# Patient Record
Sex: Male | Born: 1985 | Race: Black or African American | Hispanic: No | Marital: Single | Smoking: Former smoker
Health system: Southern US, Community
[De-identification: ages and names within clinical notes are randomized; demographics above are authoritative.]

## PROBLEM LIST (undated history)

## (undated) ENCOUNTER — Ambulatory Visit: Admission: EM | Payer: BC Managed Care – PPO

## (undated) HISTORY — PX: TONSILLECTOMY: SUR1361

## (undated) HISTORY — PX: LEG SURGERY: SHX1003

---
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
## 2006-08-31 ENCOUNTER — Emergency Department (HOSPITAL_COMMUNITY): Admission: EM | Admit: 2006-08-31 | Discharge: 2006-08-31 | Payer: Self-pay | Admitting: Emergency Medicine

## 2007-05-07 ENCOUNTER — Emergency Department (HOSPITAL_COMMUNITY): Admission: EM | Admit: 2007-05-07 | Discharge: 2007-05-07 | Payer: Self-pay | Admitting: Emergency Medicine

## 2007-05-12 ENCOUNTER — Emergency Department (HOSPITAL_COMMUNITY): Admission: EM | Admit: 2007-05-12 | Discharge: 2007-05-12 | Payer: Self-pay | Admitting: Emergency Medicine

## 2007-12-14 ENCOUNTER — Emergency Department (HOSPITAL_COMMUNITY): Admission: EM | Admit: 2007-12-14 | Discharge: 2007-12-14 | Payer: Self-pay | Admitting: Emergency Medicine

## 2008-08-04 ENCOUNTER — Emergency Department (HOSPITAL_COMMUNITY): Admission: EM | Admit: 2008-08-04 | Discharge: 2008-08-04 | Payer: Self-pay | Admitting: Family Medicine

## 2008-09-29 ENCOUNTER — Emergency Department (HOSPITAL_COMMUNITY): Admission: EM | Admit: 2008-09-29 | Discharge: 2008-09-29 | Payer: Self-pay | Admitting: Emergency Medicine

## 2009-04-07 ENCOUNTER — Emergency Department (HOSPITAL_COMMUNITY): Admission: EM | Admit: 2009-04-07 | Discharge: 2009-04-07 | Payer: Self-pay | Admitting: Emergency Medicine

## 2009-04-07 ENCOUNTER — Emergency Department (HOSPITAL_COMMUNITY): Admission: EM | Admit: 2009-04-07 | Discharge: 2009-04-07 | Payer: Self-pay | Admitting: Family Medicine

## 2009-06-24 ENCOUNTER — Emergency Department (HOSPITAL_COMMUNITY): Admission: EM | Admit: 2009-06-24 | Discharge: 2009-06-24 | Payer: Self-pay | Admitting: Emergency Medicine

## 2010-08-22 ENCOUNTER — Emergency Department (HOSPITAL_COMMUNITY)
Admission: EM | Admit: 2010-08-22 | Discharge: 2010-08-22 | Disposition: A | Discharge status: Home / Self Care | Payer: Self-pay | Attending: Emergency Medicine | Admitting: Emergency Medicine

## 2010-08-22 ENCOUNTER — Emergency Department (HOSPITAL_COMMUNITY): Payer: Self-pay

## 2010-08-22 DIAGNOSIS — R0789 Other chest pain: Secondary | ICD-10-CM | POA: Insufficient documentation

## 2010-08-22 DIAGNOSIS — X58XXXA Exposure to other specified factors, initial encounter: Secondary | ICD-10-CM | POA: Insufficient documentation

## 2010-08-22 DIAGNOSIS — S139XXA Sprain of joints and ligaments of unspecified parts of neck, initial encounter: Secondary | ICD-10-CM | POA: Insufficient documentation

## 2010-08-22 DIAGNOSIS — M542 Cervicalgia: Secondary | ICD-10-CM | POA: Insufficient documentation

## 2010-08-22 DIAGNOSIS — Y92009 Unspecified place in unspecified non-institutional (private) residence as the place of occurrence of the external cause: Secondary | ICD-10-CM | POA: Insufficient documentation

## 2010-08-30 ENCOUNTER — Emergency Department (HOSPITAL_COMMUNITY): Payer: Self-pay

## 2010-08-30 ENCOUNTER — Emergency Department (HOSPITAL_COMMUNITY)
Admission: EM | Admit: 2010-08-30 | Discharge: 2010-08-30 | Disposition: A | Discharge status: Home / Self Care | Payer: Self-pay | Attending: Emergency Medicine | Admitting: Emergency Medicine

## 2010-08-30 DIAGNOSIS — R079 Chest pain, unspecified: Secondary | ICD-10-CM | POA: Insufficient documentation

## 2010-08-30 DIAGNOSIS — R109 Unspecified abdominal pain: Secondary | ICD-10-CM | POA: Insufficient documentation

## 2010-08-30 DIAGNOSIS — M542 Cervicalgia: Secondary | ICD-10-CM | POA: Insufficient documentation

## 2010-11-29 ENCOUNTER — Emergency Department (HOSPITAL_COMMUNITY)
Admission: EM | Admit: 2010-11-29 | Discharge: 2010-11-30 | Disposition: A | Discharge status: Home / Self Care | Payer: Self-pay | Attending: Emergency Medicine | Admitting: Emergency Medicine

## 2010-11-29 DIAGNOSIS — M25519 Pain in unspecified shoulder: Secondary | ICD-10-CM | POA: Insufficient documentation

## 2010-11-30 ENCOUNTER — Emergency Department (HOSPITAL_COMMUNITY): Payer: Self-pay

## 2011-01-02 ENCOUNTER — Emergency Department (HOSPITAL_COMMUNITY)
Admission: EM | Admit: 2011-01-02 | Discharge: 2011-01-02 | Disposition: A | Discharge status: Home / Self Care | Payer: Self-pay | Attending: Emergency Medicine | Admitting: Emergency Medicine

## 2011-01-02 DIAGNOSIS — S335XXA Sprain of ligaments of lumbar spine, initial encounter: Secondary | ICD-10-CM | POA: Insufficient documentation

## 2011-01-02 DIAGNOSIS — M545 Low back pain, unspecified: Secondary | ICD-10-CM | POA: Insufficient documentation

## 2011-01-02 DIAGNOSIS — X58XXXA Exposure to other specified factors, initial encounter: Secondary | ICD-10-CM | POA: Insufficient documentation

## 2011-03-07 ENCOUNTER — Emergency Department (HOSPITAL_COMMUNITY)
Admission: EM | Admit: 2011-03-07 | Discharge: 2011-03-07 | Disposition: A | Discharge status: Home / Self Care | Payer: Self-pay | Attending: Emergency Medicine | Admitting: Emergency Medicine

## 2011-03-07 DIAGNOSIS — K029 Dental caries, unspecified: Secondary | ICD-10-CM | POA: Insufficient documentation

## 2011-03-07 DIAGNOSIS — K1370 Unspecified lesions of oral mucosa: Secondary | ICD-10-CM | POA: Insufficient documentation

## 2011-03-07 DIAGNOSIS — F17200 Nicotine dependence, unspecified, uncomplicated: Secondary | ICD-10-CM | POA: Insufficient documentation

## 2012-01-14 ENCOUNTER — Encounter (HOSPITAL_COMMUNITY): Payer: Self-pay | Admitting: Emergency Medicine

## 2012-01-14 ENCOUNTER — Emergency Department (HOSPITAL_COMMUNITY)
Admission: EM | Admit: 2012-01-14 | Discharge: 2012-01-14 | Disposition: A | Discharge status: Home / Self Care | Payer: BC Managed Care – PPO | Attending: Emergency Medicine | Admitting: Emergency Medicine

## 2012-01-14 ENCOUNTER — Emergency Department (HOSPITAL_COMMUNITY): Payer: BC Managed Care – PPO

## 2012-01-14 DIAGNOSIS — M25461 Effusion, right knee: Secondary | ICD-10-CM

## 2012-01-14 DIAGNOSIS — M25469 Effusion, unspecified knee: Secondary | ICD-10-CM | POA: Insufficient documentation

## 2012-01-14 DIAGNOSIS — M25569 Pain in unspecified knee: Secondary | ICD-10-CM | POA: Insufficient documentation

## 2012-01-14 DIAGNOSIS — M25561 Pain in right knee: Secondary | ICD-10-CM

## 2012-01-14 LAB — CBC WITH DIFFERENTIAL/PLATELET
Hemoglobin: 15.3 g/dL (ref 13.0–17.0)
Lymphs Abs: 2 10*3/uL (ref 0.7–4.0)
Monocytes Relative: 7 % (ref 3–12)
Neutro Abs: 3.1 10*3/uL (ref 1.7–7.7)
Neutrophils Relative %: 54 % (ref 43–77)
Platelets: 186 10*3/uL (ref 150–400)
RBC: 5.32 MIL/uL (ref 4.22–5.81)
WBC: 5.7 10*3/uL (ref 4.0–10.5)

## 2012-01-14 LAB — URINALYSIS, ROUTINE W REFLEX MICROSCOPIC
Bilirubin Urine: NEGATIVE
Hgb urine dipstick: NEGATIVE
Nitrite: NEGATIVE
Specific Gravity, Urine: 1.026 (ref 1.005–1.030)
Urobilinogen, UA: 1 mg/dL (ref 0.0–1.0)
pH: 7 (ref 5.0–8.0)

## 2012-01-14 LAB — BASIC METABOLIC PANEL
BUN: 11 mg/dL (ref 6–23)
Chloride: 103 mEq/L (ref 96–112)
GFR calc Af Amer: >90 mL/min (ref >90)
GFR calc non Af Amer: >90 mL/min (ref >90)
Glucose, Bld: 104 mg/dL — ABNORMAL HIGH (ref 70–99)
Potassium: 3.9 mEq/L (ref 3.5–5.1)
Sodium: 138 mEq/L (ref 135–145)

## 2012-01-14 LAB — URIC ACID: Uric Acid, Serum: 5.7 mg/dL (ref 4.0–7.8)

## 2012-01-14 MED ORDER — OXYCODONE-ACETAMINOPHEN 5-325 MG PO TABS
ORAL_TABLET | ORAL | Status: AC
  Administered 2012-01-14: 1
  Filled 2012-01-14: qty 1

## 2012-01-14 MED ORDER — OXYCODONE-ACETAMINOPHEN 5-325 MG PO TABS: 1.0000 | ORAL_TABLET | Freq: Four times a day (QID) | ORAL | Status: AC | PRN

## 2012-01-14 MED ORDER — OXYCODONE-ACETAMINOPHEN 5-325 MG PO TABS
2.0000 | ORAL_TABLET | Freq: Once | ORAL | Status: AC
  Administered 2012-01-14: 2 via ORAL
  Filled 2012-01-14: qty 2

## 2012-01-14 NOTE — ED Provider Notes (Signed)
Medical screening examination/treatment/procedure(s) were conducted as a shared visit with non-physician practitioner(s) and myself.  I personally evaluated the patient during the encounter  Right knee pain with small effusion. No overlying cellulitis, redness, or warmth. Good range of motion.  Do not suspect septic joint.  Glynn Octave, MD 01/14/12 2250
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
## 2012-03-14 ENCOUNTER — Encounter (HOSPITAL_COMMUNITY): Payer: Self-pay | Admitting: Emergency Medicine

## 2012-03-14 ENCOUNTER — Emergency Department (INDEPENDENT_AMBULATORY_CARE_PROVIDER_SITE_OTHER)
Admission: EM | Admit: 2012-03-14 | Discharge: 2012-03-14 | Disposition: A | Discharge status: Home / Self Care | Payer: BC Managed Care – PPO | Source: Home / Self Care | Attending: Emergency Medicine | Admitting: Emergency Medicine

## 2012-03-14 DIAGNOSIS — I889 Nonspecific lymphadenitis, unspecified: Secondary | ICD-10-CM

## 2012-03-14 DIAGNOSIS — L0231 Cutaneous abscess of buttock: Secondary | ICD-10-CM

## 2012-03-14 MED ORDER — DOXYCYCLINE HYCLATE 100 MG PO CAPS: 100.0000 mg | ORAL_CAPSULE | Freq: Two times a day (BID) | ORAL | Status: AC

## 2012-03-14 MED ORDER — LIDOCAINE HCL (PF) 1 % IJ SOLN
INTRAMUSCULAR | Status: AC
  Filled 2012-03-14: qty 5

## 2012-03-14 MED ORDER — CEFTRIAXONE SODIUM 1 G IJ SOLR
1.0000 g | Freq: Once | INTRAMUSCULAR | Status: AC
  Administered 2012-03-14: 1 g via INTRAMUSCULAR

## 2012-03-14 MED ORDER — HYDROCODONE-ACETAMINOPHEN 5-500 MG PO TABS: 1.0000 | ORAL_TABLET | Freq: Four times a day (QID) | ORAL | Status: DC | PRN

## 2012-03-14 MED ORDER — HYDROCODONE-ACETAMINOPHEN 5-325 MG PO TABS
ORAL_TABLET | ORAL | Status: AC
  Filled 2012-03-14: qty 1

## 2012-03-14 MED ORDER — CEFTRIAXONE SODIUM 1 G IJ SOLR
INTRAMUSCULAR | Status: AC
  Filled 2012-03-14: qty 10

## 2012-03-14 MED ORDER — DOXYCYCLINE HYCLATE 100 MG PO CAPS: 100.0000 mg | ORAL_CAPSULE | Freq: Two times a day (BID) | ORAL | Status: DC

## 2012-03-14 NOTE — ED Provider Notes (Signed)
History     CSN: [id number]  Arrival date & time 03/14/12  1027   First MD Initiated Contact with Patient 03/14/12 1228      Chief Complaint  Patient presents with  . Recurrent Skin Infections    boils in btwn buttocks x 2 wks. pt is having pain no drainage. gradually getting worse.     (Consider location/radiation/quality/duration/timing/severity/associated sxs/prior treatment) HPI Comments: Patient presents urgent care complaining of an ongoing problem on the right side of his buttocks that have been progressively worse and larger for the last 2 weeks. It has not drained and is on have been applying heat and warm compresses. No fevers no chills he been using some pads with no relief. No fevers or chills and no drainage. Patient is also feeling some discomfort and pain and palpable bumps on his right groin area.  The history is provided by the patient.    History reviewed. No pertinent past medical history.  History reviewed. No pertinent past surgical history.  History reviewed. No pertinent family history.  History  Substance Use Topics  . Smoking status: Current Every Day Smoker -- 1.0 packs/day    Types: Cigarettes  . Smokeless tobacco: Not on file  . Alcohol Use: Yes     Comment: occasional      Review of Systems  Constitutional: Positive for appetite change. Negative for fever, chills, fatigue and unexpected weight change.  Skin: Negative for rash.    Allergies  Review of patient's allergies indicates no known allergies.  Home Medications   Current Outpatient Rx  Name  Route  Sig  Dispense  Refill  . DOXYCYCLINE HYCLATE 100 MG PO CAPS   Oral   Take 1 capsule (100 mg total) by mouth 2 (two) times daily.   20 capsule   0   . HYDROCODONE-ACETAMINOPHEN 5-500 MG PO TABS   Oral   Take 1-2 tablets by mouth every 6 (six) hours as needed for pain.   15 tablet   0     BP 122/73  Pulse 84  Temp 97.8 F (36.6 C) (Oral)  Resp 16  SpO2 100%  Physical  Exam  Nursing note and vitals reviewed. Constitutional: Vital signs are normal. He appears well-developed and well-nourished.  Non-toxic appearance. He does not have a sickly appearance. He does not appear ill. No distress.  Neck: Neck supple.  Abdominal: Soft. There is no tenderness.  Musculoskeletal:       Lumbar back: He exhibits swelling.       Back:       Legs: Neurological: He is alert.  Skin: There is erythema.    ED Course  INCISION AND DRAINAGE Performed by: Arthi Mcdonald Authorized by: Jimmie Molly Consent: Verbal consent obtained. Risks and benefits: risks, benefits and alternatives were discussed Consent given by: patient Patient understanding: patient states understanding of the procedure being performed Patient identity confirmed: verbally with patient Type: abscess Body area: anogenital Anesthesia: local infiltration Local anesthetic: lidocaine 1% with epinephrine Patient sedated: yes Scalpel size: 15 Needle gauge: 18 Complexity: complex Drainage: purulent Drainage amount: copious Packing material: 1/4 in iodoform gauze and 1/4 in gauze Patient tolerance: Patient tolerated the procedure well with no immediate complications.   (including critical care time)   Labs Reviewed  CULTURE, ROUTINE-ABSCESS   No results found.   1. Abscess of buttock   2. Lymphadenitis       MDM  Right buttocks moderate large abscess with regional lymphadenitis. I + D, abscess. Afebrile.  Sample obtained for cultures patient given a gram of ceftriaxone and started on doxycycline prescription for Lortab for pain management patient instructed to return in 2 days for packing removal and recheck. Patient agreed with treatment plan and followup care as instructed.         Jimmie Molly, MD 03/14/12 [phone]

## 2012-03-14 NOTE — ED Notes (Signed)
Pt c/o 4 boil in btwn buttocks area x 2wks.   Pt states gradual on set and having pain, denies drainage.  Has tried otc meds and boil pads with no relief.

## 2012-03-15 NOTE — ED Notes (Signed)
Final result pending

## 2012-03-16 ENCOUNTER — Emergency Department (INDEPENDENT_AMBULATORY_CARE_PROVIDER_SITE_OTHER)
Admission: EM | Admit: 2012-03-16 | Discharge: 2012-03-16 | Disposition: A | Discharge status: Home / Self Care | Payer: BC Managed Care – PPO | Source: Home / Self Care

## 2012-03-16 ENCOUNTER — Encounter (HOSPITAL_COMMUNITY): Payer: Self-pay

## 2012-03-16 DIAGNOSIS — L0291 Cutaneous abscess, unspecified: Secondary | ICD-10-CM

## 2012-03-16 DIAGNOSIS — Z4800 Encounter for change or removal of nonsurgical wound dressing: Secondary | ICD-10-CM

## 2012-03-16 NOTE — ED Provider Notes (Signed)
History     CSN: [id number]  Arrival date & time 03/16/12  1606   None     Chief Complaint  Patient presents with  . Follow-up    follow up on abcess    (Consider location/radiation/quality/duration/timing/severity/associated sxs/prior treatment) HPI Comments: This 26 year old patient was here 2 days ago with an abscess to the left buttock. The PCP treated him with incision and drainage and inserted a quarter-inch iodoform gauze. The patient states it is feeling better but he is still having some pain. He is here to have a wound check and packing removal. He is not having any systemic symptoms such as malaise or fever.   History reviewed. No pertinent past medical history.  History reviewed. No pertinent past surgical history.  History reviewed. No pertinent family history.  History  Substance Use Topics  . Smoking status: Current Every Day Smoker -- 1.0 packs/day    Types: Cigarettes  . Smokeless tobacco: Not on file  . Alcohol Use: Yes     Comment: occasional      Review of Systems  All other systems reviewed and are negative.    Allergies  Review of patient's allergies indicates no known allergies.  Home Medications   Current Outpatient Rx  Name  Route  Sig  Dispense  Refill  . DOXYCYCLINE HYCLATE 100 MG PO CAPS   Oral   Take 1 capsule (100 mg total) by mouth 2 (two) times daily.   20 capsule   0   . HYDROCODONE-ACETAMINOPHEN 5-500 MG PO TABS   Oral   Take 1-2 tablets by mouth every 6 (six) hours as needed for pain.   15 tablet   0     BP 116/69  Pulse 79  Temp 98.4 F (36.9 C) (Oral)  Resp 18  SpO2 99%  Physical Exam  Constitutional: He appears well-developed and well-nourished. No distress.  Skin: Skin is warm and dry. No rash noted. No erythema.       The packing remains in place. Once removed there is an approximately 4 x 4 centimeter area of induration but there is no additional drainage. There is also no erythema or evidence of  lymphangitis.    ED Course  Procedures (including critical care time)  Labs Reviewed - No data to display No results found.   1. Cellulitis and abscess       MDM  The packing was removed. Manual expression did not produce any additional exudate. A gauze dressing was reapplied. He can now take a shower however he should use a paper towel or similar cough to press on the wound to make out any water that may have injured the wound. The wound should be expected close within a couple days. After that he may use a Band-Aid. For any worsening such as development of an abscess as he had 2 days previously, increased pain draining swelling or redness he is to return. Continue taking all her antibiotic. Culture result was preliminary with a few diplococci bacteria, but no growth. Call results if there is evidence that the bacteria is susceptible to the doxycycline.        Hayden Rasmussen, NP 03/16/12 1754

## 2012-03-16 NOTE — ED Notes (Signed)
To stated had abcess I&D two days ago with packing. Here today to have packing removed. Pt states he started on antibiotics yesterday

## 2012-03-17 LAB — CULTURE, ROUTINE-ABSCESS: Gram Stain: NONE SEEN

## 2012-03-17 NOTE — ED Provider Notes (Signed)
Medical screening examination/treatment/procedure(s) were performed by resident physician or non-physician practitioner and as supervising physician I was immediately available for consultation/collaboration.   Fenix Ruppe DOUGLAS MD.    Jonmichael Beadnell D Fergie Sherbert, MD 03/17/12 0910 

## 2012-09-23 ENCOUNTER — Encounter (HOSPITAL_COMMUNITY): Payer: Self-pay | Admitting: Emergency Medicine

## 2012-09-23 ENCOUNTER — Emergency Department (HOSPITAL_COMMUNITY): Payer: BC Managed Care – PPO

## 2012-09-23 ENCOUNTER — Emergency Department (HOSPITAL_COMMUNITY)
Admission: EM | Admit: 2012-09-23 | Discharge: 2012-09-23 | Disposition: A | Discharge status: Home / Self Care | Payer: BC Managed Care – PPO | Attending: Emergency Medicine | Admitting: Emergency Medicine

## 2012-09-23 DIAGNOSIS — R0781 Pleurodynia: Secondary | ICD-10-CM

## 2012-09-23 DIAGNOSIS — F17200 Nicotine dependence, unspecified, uncomplicated: Secondary | ICD-10-CM | POA: Insufficient documentation

## 2012-09-23 DIAGNOSIS — S298XXA Other specified injuries of thorax, initial encounter: Secondary | ICD-10-CM | POA: Insufficient documentation

## 2012-09-23 MED ORDER — HYDROCODONE-ACETAMINOPHEN 5-325 MG PO TABS: 1.0000 | ORAL_TABLET | Freq: Four times a day (QID) | ORAL | Status: DC | PRN
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
## 2012-09-24 NOTE — ED Provider Notes (Signed)
Medical screening examination/treatment/procedure(s) were performed by non-physician practitioner and as supervising physician I was immediately available for consultation/collaboration.  Raeford Razor, MD 09/24/12 [phone]

## 2012-10-01 ENCOUNTER — Telehealth (HOSPITAL_COMMUNITY): Payer: Self-pay | Admitting: Emergency Medicine

## 2012-10-01 NOTE — ED Notes (Signed)
Patient in ED requesting Discharge Papers

## 2012-11-13 ENCOUNTER — Encounter (HOSPITAL_COMMUNITY): Payer: Self-pay | Admitting: Emergency Medicine

## 2012-11-13 ENCOUNTER — Emergency Department (HOSPITAL_COMMUNITY)
Admission: EM | Admit: 2012-11-13 | Discharge: 2012-11-13 | Disposition: A | Discharge status: Home / Self Care | Payer: Self-pay | Attending: Emergency Medicine | Admitting: Emergency Medicine

## 2012-11-13 DIAGNOSIS — F17200 Nicotine dependence, unspecified, uncomplicated: Secondary | ICD-10-CM | POA: Insufficient documentation

## 2012-11-13 DIAGNOSIS — R197 Diarrhea, unspecified: Secondary | ICD-10-CM | POA: Insufficient documentation

## 2012-11-13 DIAGNOSIS — R1110 Vomiting, unspecified: Secondary | ICD-10-CM | POA: Insufficient documentation

## 2012-11-13 DIAGNOSIS — R109 Unspecified abdominal pain: Secondary | ICD-10-CM | POA: Insufficient documentation

## 2012-11-13 LAB — CBC WITH DIFFERENTIAL/PLATELET
Basophils Absolute: 0 10*3/uL (ref 0.0–0.1)
HCT: 42.7 % (ref 39.0–52.0)
Hemoglobin: 14.5 g/dL (ref 13.0–17.0)
Lymphocytes Relative: 47 % — ABNORMAL HIGH (ref 12–46)
MCH: 28.9 pg (ref 26.0–34.0)
MCHC: 34 g/dL (ref 30.0–36.0)
MCV: 85.2 fL (ref 78.0–100.0)
Neutro Abs: 2.1 10*3/uL (ref 1.7–7.7)
Neutrophils Relative %: 43 % (ref 43–77)
Platelets: 191 10*3/uL (ref 150–400)
RBC: 5.01 MIL/uL (ref 4.22–5.81)
RDW: 13.6 % (ref 11.5–15.5)

## 2012-11-13 LAB — COMPREHENSIVE METABOLIC PANEL
ALT: 12 U/L (ref 0–53)
AST: 20 U/L (ref 0–37)
CO2: 24 mEq/L (ref 19–32)
Calcium: 9.2 mg/dL (ref 8.4–10.5)
Chloride: 105 mEq/L (ref 96–112)
GFR calc non Af Amer: >90 mL/min (ref >90)
Sodium: 139 mEq/L (ref 135–145)
Total Bilirubin: 0.4 mg/dL (ref 0.3–1.2)

## 2012-11-13 LAB — URINALYSIS, ROUTINE W REFLEX MICROSCOPIC
Hgb urine dipstick: NEGATIVE
Ketones, ur: 15 mg/dL — AB
Nitrite: NEGATIVE
Specific Gravity, Urine: 1.031 — ABNORMAL HIGH (ref 1.005–1.030)
pH: 5.5 (ref 5.0–8.0)

## 2012-11-13 MED ORDER — ONDANSETRON 4 MG PO TBDP
8.0000 mg | ORAL_TABLET | Freq: Once | ORAL | Status: DC
  Filled 2012-11-13: qty 2

## 2012-11-13 MED ORDER — ONDANSETRON HCL 8 MG PO TABS: 8.0000 mg | ORAL_TABLET | Freq: Two times a day (BID) | ORAL | Status: DC | PRN

## 2012-11-13 NOTE — ED Notes (Signed)
The pt has asked about the wait time x4  Every  Few minutes.  Wait time advised
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
## 2012-11-14 NOTE — ED Provider Notes (Signed)
   History    CSN: [id number] Arrival date & time 11/13/12  1910  First MD Initiated Contact with Patient 11/13/12 2157     Chief Complaint  Patient presents with  . Emesis  . Diarrhea    Patient is a 27 y.o. male presenting with vomiting. The history is provided by the patient.  Emesis Severity:  Mild Duration:  1 day Timing:  Intermittent Chronicity:  New Relieved by:  Nothing Worsened by:  Nothing tried Associated symptoms: diarrhea    Pt reports that he was exposed to someone with vomiting/diarrhea He reports that since yesterday he has had vomiting and diarrhea.  He also reports mild abd pain He has had about two episodes of both.  No blood in vomit or stool No fever/cough No rash He is otherwise healthy  PMH - none History reviewed. No pertinent past surgical history. No family history on file. History  Substance Use Topics  . Smoking status: Current Every Day Smoker -- 1.00 packs/day    Types: Cigarettes  . Smokeless tobacco: Not on file  . Alcohol Use: Yes     Comment: occasional    Review of Systems  Constitutional: Negative for fever.  Respiratory: Negative for cough.   Gastrointestinal: Positive for vomiting and diarrhea. Negative for blood in stool.  Musculoskeletal: Negative for back pain.  Neurological: Negative for weakness.  All other systems reviewed and are negative.    Allergies  Review of patient's allergies indicates no known allergies.  Home Medications   Current Outpatient Rx  Name  Route  Sig  Dispense  Refill  . ondansetron (ZOFRAN) 8 MG tablet   Oral   Take 1 tablet (8 mg total) by mouth every 12 (twelve) hours as needed for nausea.   4 tablet   0    BP 139/84  Pulse 69  Temp(Src) 98.4 F (36.9 C) (Oral)  Resp 16  SpO2 98% Physical Exam CONSTITUTIONAL: Well developed/well nourished HEAD: Normocephalic/atraumatic EYES: EOMI/PERRL, no icterus ENMT: Mucous membranes moist NECK: supple no meningeal signs SPINE:entire  spine nontender CV: S1/S2 noted, no murmurs/rubs/gallops noted LUNGS: Lungs are clear to auscultation bilaterally, no apparent distress ABDOMEN: soft, nontender, no rebound or guarding NEURO: Pt is awake/alert, moves all extremitiesx4 EXTREMITIES: pulses normal, full ROM SKIN: warm, color normal PSYCH: no abnormalities of mood noted  ED Course  Procedures Labs Reviewed  URINALYSIS, ROUTINE W REFLEX MICROSCOPIC - Abnormal; Notable for the following:    Specific Gravity, Urine 1.031 (*)    Bilirubin Urine SMALL (*)    Ketones, ur 15 (*)    All other components within normal limits  CBC WITH DIFFERENTIAL - Abnormal; Notable for the following:    Lymphocytes Relative 47 (*)    All other components within normal limits  COMPREHENSIVE METABOLIC PANEL - Abnormal; Notable for the following:    Glucose, Bld 127 (*)    All other components within normal limits   No results found. 1. Vomiting and diarrhea     Pt well appearing, no distress, most of his symptoms resolved.  Suspect viral gastroenteritis Pt is stable for d/c home   MDM  Nursing notes including past medical history and social history reviewed and considered in documentation Labs/vital reviewed and considered   Joya Gaskins, MD 11/14/12 0021

## 2012-12-16 ENCOUNTER — Emergency Department (HOSPITAL_COMMUNITY): Payer: BC Managed Care – PPO

## 2012-12-16 ENCOUNTER — Emergency Department (HOSPITAL_COMMUNITY)
Admission: EM | Admit: 2012-12-16 | Discharge: 2012-12-16 | Disposition: A | Discharge status: Home / Self Care | Payer: BC Managed Care – PPO | Attending: Emergency Medicine | Admitting: Emergency Medicine

## 2012-12-16 ENCOUNTER — Encounter (HOSPITAL_COMMUNITY): Payer: Self-pay | Admitting: Emergency Medicine

## 2012-12-16 DIAGNOSIS — J069 Acute upper respiratory infection, unspecified: Secondary | ICD-10-CM | POA: Insufficient documentation

## 2012-12-16 DIAGNOSIS — R599 Enlarged lymph nodes, unspecified: Secondary | ICD-10-CM | POA: Insufficient documentation

## 2012-12-16 DIAGNOSIS — R51 Headache: Secondary | ICD-10-CM | POA: Insufficient documentation

## 2012-12-16 DIAGNOSIS — R6883 Chills (without fever): Secondary | ICD-10-CM | POA: Insufficient documentation

## 2012-12-16 DIAGNOSIS — IMO0001 Reserved for inherently not codable concepts without codable children: Secondary | ICD-10-CM | POA: Insufficient documentation

## 2012-12-16 DIAGNOSIS — R05 Cough: Secondary | ICD-10-CM | POA: Insufficient documentation

## 2012-12-16 DIAGNOSIS — F17200 Nicotine dependence, unspecified, uncomplicated: Secondary | ICD-10-CM | POA: Insufficient documentation

## 2012-12-16 DIAGNOSIS — R079 Chest pain, unspecified: Secondary | ICD-10-CM | POA: Insufficient documentation

## 2012-12-16 DIAGNOSIS — R059 Cough, unspecified: Secondary | ICD-10-CM | POA: Insufficient documentation

## 2012-12-16 DIAGNOSIS — J3489 Other specified disorders of nose and nasal sinuses: Secondary | ICD-10-CM | POA: Insufficient documentation

## 2012-12-16 DIAGNOSIS — J029 Acute pharyngitis, unspecified: Secondary | ICD-10-CM | POA: Insufficient documentation

## 2012-12-16 MED ORDER — BUTAMBEN-TETRACAINE-BENZOCAINE 2-2-14 % EX AERO: 1.0000 | INHALATION_SPRAY | CUTANEOUS | Status: DC | PRN

## 2012-12-16 MED ORDER — ALBUTEROL SULFATE HFA 108 (90 BASE) MCG/ACT IN AERS
2.0000 | INHALATION_SPRAY | Freq: Once | RESPIRATORY_TRACT | Status: AC
  Administered 2012-12-16: 2 via RESPIRATORY_TRACT
  Filled 2012-12-16: qty 6.7

## 2012-12-16 MED ORDER — HYDROCODONE-HOMATROPINE 5-1.5 MG/5ML PO SYRP: 2.5000 mL | ORAL_SOLUTION | Freq: Four times a day (QID) | ORAL | Status: DC | PRN

## 2012-12-16 NOTE — ED Notes (Signed)
Pt c/o cough, chills, fever x 2 days. Tx with [place]

## 2012-12-16 NOTE — ED Provider Notes (Signed)
CSN: [id number]     Arrival date & time 12/16/12  1133 History     First MD Initiated Contact with Patient 12/16/12 1141     Chief Complaint  Patient presents with  . Cough  . Sore Throat  . Chills    c/o URI sx x 2 days   (Consider location/radiation/quality/duration/timing/severity/associated sxs/prior Treatment) Patient is a 27 y.o. male presenting with pharyngitis and URI. The history is provided by the patient. No language interpreter was used.  Sore Throat This is a new problem. Episode onset: 2 days. The problem occurs constantly. The problem has been gradually worsening. Associated symptoms include chest pain (with cough), chills, congestion, coughing, headaches, myalgias, a sore throat and swollen glands. Pertinent negatives include no abdominal pain, anorexia, arthralgias, change in bowel habit, diaphoresis, fatigue, fever, joint swelling, neck pain, numbness, rash, urinary symptoms, vertigo, visual change, vomiting or weakness. The symptoms are aggravated by coughing. Treatments tried: robitussin.  URI Presenting symptoms: congestion, cough and sore throat   Presenting symptoms: no fatigue and no fever   Associated symptoms: headaches, myalgias and swollen glands   Associated symptoms: no arthralgias and no neck pain   Risk factors: not elderly, no chronic cardiac disease, no chronic kidney disease, no chronic respiratory disease, no diabetes mellitus, no immunosuppression, no recent illness, no recent travel and no sick contacts   Risk factors comment:  Smoker   History reviewed. No pertinent past medical history. Past Surgical History  Procedure Laterality Date  . Tonsillectomy     Family History  Problem Relation Age of Onset  . Diabetes Other   . Hypertension Other    History  Substance Use Topics  . Smoking status: Current Every Day Smoker -- 1.00 packs/day    Types: Cigarettes  . Smokeless tobacco: Not on file  . Alcohol Use: Yes     Comment: occasional     Review of Systems  Constitutional: Positive for chills. Negative for fever, diaphoresis and fatigue.  HENT: Positive for congestion and sore throat. Negative for neck pain.   Respiratory: Positive for cough.   Cardiovascular: Positive for chest pain (with cough).  Gastrointestinal: Negative for vomiting, abdominal pain, anorexia and change in bowel habit.  Musculoskeletal: Positive for myalgias. Negative for joint swelling and arthralgias.  Skin: Negative for rash.  Neurological: Positive for headaches. Negative for vertigo, weakness and numbness.    Allergies  Review of patient's allergies indicates no known allergies.  Home Medications   Current Outpatient Rx  Name  Route  Sig  Dispense  Refill  . ondansetron (ZOFRAN) 8 MG tablet   Oral   Take 1 tablet (8 mg total) by mouth every 12 (twelve) hours as needed for nausea.   4 tablet   0    BP 112/66  Pulse 68  Temp(Src) 97.9 F (36.6 C) (Oral)  Wt 170 lb (77.111 kg)  SpO2 99% Physical Exam  Nursing note and vitals reviewed. Constitutional: He appears well-developed and well-nourished. No distress.  HENT:  Head: Normocephalic and atraumatic.  Eyes: Conjunctivae are normal. No scleral icterus.  Neck: Normal range of motion. Neck supple.  Cardiovascular: Normal rate, regular rhythm and normal heart sounds.   Pulmonary/Chest: Effort normal. No respiratory distress.  Expiratory wheezes heard best over left upper lobe  Abdominal: Soft. There is no tenderness.  Musculoskeletal: He exhibits no edema.  Neurological: He is alert.  Skin: Skin is warm and dry. He is not diaphoretic.  Psychiatric: His behavior is normal.  ED Course   Procedures (including critical care time)  Labs Reviewed  RAPID STREP SCREEN   No results found. 1. URI (upper respiratory infection)   2. Pharyngitis     MDM  BP 112/66  Pulse 68  Temp(Src) 97.9 F (36.6 C) (Oral)  Wt 170 lb (77.111 kg)  SpO2 99% Patient with c/o sxs URI.  Wheezing, cxr . Will treat with albuterol    12:31 PM BP 112/66  Pulse 68  Temp(Src) 97.9 F (36.6 C) (Oral)  Wt 170 lb (77.111 kg)  SpO2 99% Pt CXR negative for acute infiltrate. Patients symptoms are consistent with URI, likely viral etiology. Wheezing present.Discussed that antibiotics are not indicated for viral infections. Pt will be discharged with symptomatic treatment.  Verbalizes understanding and is agreeable with plan. Pt is hemodynamically stable & in NAD prior to dc.   Arthor Captain, PA-C 12/16/12 1312

## 2012-12-17 NOTE — ED Provider Notes (Signed)
Medical screening examination/treatment/procedure(s) were performed by non-physician practitioner and as supervising physician I was immediately available for consultation/collaboration.   Laray Anger, DO 12/17/12 [phone]

## 2012-12-18 LAB — CULTURE, GROUP A STREP

## 2013-04-24 ENCOUNTER — Encounter (HOSPITAL_COMMUNITY): Payer: Self-pay | Admitting: Emergency Medicine

## 2013-04-24 ENCOUNTER — Emergency Department (INDEPENDENT_AMBULATORY_CARE_PROVIDER_SITE_OTHER)
Admission: EM | Admit: 2013-04-24 | Discharge: 2013-04-24 | Disposition: A | Discharge status: Home / Self Care | Payer: BC Managed Care – PPO | Source: Home / Self Care | Attending: Family Medicine | Admitting: Family Medicine

## 2013-04-24 DIAGNOSIS — S0990XA Unspecified injury of head, initial encounter: Secondary | ICD-10-CM
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
## 2013-04-24 NOTE — ED Provider Notes (Signed)
Medical screening examination/treatment/procedure(s) were performed by resident physician or non-physician practitioner and as supervising physician I was immediately available for consultation/collaboration.   Velia Pamer DOUGLAS MD.   Kaeleigh Westendorf D Sharman Garrott, MD 04/24/13 2051 

## 2013-05-04 ENCOUNTER — Emergency Department (HOSPITAL_COMMUNITY)
Admission: EM | Admit: 2013-05-04 | Discharge: 2013-05-04 | Disposition: A | Discharge status: Home / Self Care | Payer: BC Managed Care – PPO | Attending: Emergency Medicine | Admitting: Emergency Medicine

## 2013-05-04 ENCOUNTER — Encounter (HOSPITAL_COMMUNITY): Payer: Self-pay | Admitting: Emergency Medicine

## 2013-05-04 ENCOUNTER — Emergency Department (HOSPITAL_COMMUNITY): Payer: BC Managed Care – PPO

## 2013-05-04 DIAGNOSIS — F0781 Postconcussional syndrome: Secondary | ICD-10-CM

## 2013-05-04 DIAGNOSIS — H5310 Unspecified subjective visual disturbances: Secondary | ICD-10-CM | POA: Insufficient documentation

## 2013-05-04 DIAGNOSIS — H9319 Tinnitus, unspecified ear: Secondary | ICD-10-CM | POA: Insufficient documentation

## 2013-05-04 DIAGNOSIS — Z87891 Personal history of nicotine dependence: Secondary | ICD-10-CM | POA: Insufficient documentation

## 2013-05-04 DIAGNOSIS — R110 Nausea: Secondary | ICD-10-CM | POA: Insufficient documentation

## 2013-05-04 MED ORDER — TRAMADOL HCL 50 MG PO TABS: 50.0000 mg | ORAL_TABLET | Freq: Four times a day (QID) | ORAL | Status: DC | PRN

## 2013-05-04 MED ORDER — NAPROXEN 500 MG PO TABS: 500.0000 mg | ORAL_TABLET | Freq: Two times a day (BID) | ORAL | Status: DC
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
## 2013-05-05 NOTE — ED Provider Notes (Signed)
Medical screening examination/treatment/procedure(s) were performed by non-physician practitioner and as supervising physician I was immediately available for consultation/collaboration.  Daisee Centner M Danayah Smyre, MD 05/05/13 1627 

## 2013-08-18 ENCOUNTER — Emergency Department (HOSPITAL_COMMUNITY)
Admission: EM | Admit: 2013-08-18 | Discharge: 2013-08-18 | Disposition: A | Discharge status: Home / Self Care | Payer: BC Managed Care – PPO | Attending: Emergency Medicine | Admitting: Emergency Medicine

## 2013-08-18 ENCOUNTER — Encounter (HOSPITAL_COMMUNITY): Payer: Self-pay | Admitting: Emergency Medicine

## 2013-08-18 DIAGNOSIS — Z87891 Personal history of nicotine dependence: Secondary | ICD-10-CM | POA: Insufficient documentation

## 2013-08-18 DIAGNOSIS — M549 Dorsalgia, unspecified: Secondary | ICD-10-CM

## 2013-08-18 DIAGNOSIS — M545 Low back pain, unspecified: Secondary | ICD-10-CM | POA: Insufficient documentation

## 2013-08-18 DIAGNOSIS — Z791 Long term (current) use of non-steroidal anti-inflammatories (NSAID): Secondary | ICD-10-CM | POA: Insufficient documentation

## 2013-08-18 MED ORDER — TRAMADOL HCL 50 MG PO TABS: 50.0000 mg | ORAL_TABLET | Freq: Four times a day (QID) | ORAL | Status: DC | PRN

## 2013-08-18 NOTE — ED Provider Notes (Signed)
Medical screening examination/treatment/procedure(s) were performed by non-physician practitioner and as supervising physician I was immediately available for consultation/collaboration.   EKG Interpretation None       Juliet RudeNathan R. Rubin PayorPickering, MD 08/18/13 2348

## 2013-08-18 NOTE — ED Provider Notes (Signed)
CSN: [id number]     Arrival date & time 08/18/13  1251 History  This chart was scribed for non-physician practitioner, Sharilyn SitesLisa Sanders, PA-C, working with Juliet RudeNathan R. Rubin PayorPickering, MD, by Ellin MayhewMichael Levi, ED Scribe. This patient was seen in room WTR5/WTR5 and the patient's care was started at 3:31 PM.  The history is provided by the patient. No language interpreter was used.   HPI Comments: Michael Garza is a 28 y.o. male who presents to the Emergency Department with a chief complaint of back pain with onset one week. Patient characterizes the pain as a severe ache which radiates down the R leg. Patient states that the pain is made worse with change in position and movement. Patient denies any fall or injury to the area. Patient denies any past back surgery or trauma. He denies any recent heavy lifting. He states he has taken Vicodin, naproxen, and tramadol that he had from last yet, with temporary relief. He is ambulatory to the ED. He works at [employer] for five years with no prior issues. He states he tried working yesterday and had to stop early. Patient reports not having a PCP.  Denies numbness or paresthesias of LE.  No loss of bowel or bladder control.  History reviewed. No pertinent past medical history. Past Surgical History  Procedure Laterality Date  . Tonsillectomy     Family History  Problem Relation Age of Onset  . Diabetes Other   . Hypertension Other    History  Substance Use Topics  . Smoking status: Former Smoker -- 1.00 packs/day    Types: Cigarettes  . Smokeless tobacco: Not on file  . Alcohol Use: Yes     Comment: occasional    Review of Systems  Constitutional: Negative for fever, chills and fatigue.  Respiratory: Negative for shortness of breath.   Gastrointestinal: Negative for nausea, vomiting and diarrhea.  Musculoskeletal: Positive for arthralgias and back pain. Negative for neck pain and neck stiffness.  Neurological: Negative for weakness.  All other systems reviewed and  are negative.  Allergies  Review of patient's allergies indicates no known allergies.  Home Medications   Current Outpatient Rx  Name  Route  Sig  Dispense  Refill  . aspirin-acetaminophen-caffeine (EXCEDRIN MIGRAINE) 250-250-65 MG per tablet   Oral   Take 1 tablet by mouth every 6 (six) hours as needed for headache.         . ibuprofen (ADVIL,MOTRIN) 200 MG tablet   Oral   Take 400 mg by mouth every 6 (six) hours as needed for headache.         . naproxen (NAPROSYN) 500 MG tablet   Oral   Take 1 tablet (500 mg total) by mouth 2 (two) times daily.   20 tablet   0   . traMADol (ULTRAM) 50 MG tablet   Oral   Take 1 tablet (50 mg total) by mouth every 6 (six) hours as needed.   15 tablet   0    Triage Vitals: BP 118/79  Pulse 62  Temp(Src) 98 F (36.7 C) (Oral)  Resp 16  SpO2 97%  Physical Exam  Nursing note and vitals reviewed. Constitutional: He is oriented to person, place, and time. He appears well-developed and well-nourished.  HENT:  Head: Normocephalic and atraumatic.  Mouth/Throat: Oropharynx is clear and moist.  Eyes: Conjunctivae and EOM are normal. Pupils are equal, round, and reactive to light.  Neck: Normal range of motion.  Cardiovascular: Normal rate, regular rhythm and normal heart sounds.  Pulmonary/Chest: Effort normal and breath sounds normal.  Abdominal: Soft. Bowel sounds are normal.  Musculoskeletal: Normal range of motion. He exhibits tenderness.       Lumbar back: He exhibits tenderness and pain.  Lumbar spine diffusely TTP without mid-line step off or noted deformity. + right straight leg rase. FROM maintained. Sensation intact diffusely throughout limb. Ambulating without difficulty.  Neurological: He is alert and oriented to person, place, and time.  Skin: Skin is warm and dry.  Psychiatric: He has a normal mood and affect.    ED Course  Procedures (including critical care time)  DIAGNOSTIC STUDIES: Oxygen Saturation is 97% on  room air, adequate by my interpretation.    COORDINATION OF CARE: 3:34 PM-Ordered tramadol for patient. Treatment plan discussed with patient and patient agrees.  Labs Review Labs Reviewed - No data to display Imaging Review No results found.   EKG Interpretation None      MDM   Final diagnoses:  Back pain   Atraumatic, diffuse low back pain. No midline step-off or deformities-- low suspicion for acute fx or subluxation.  No focal neuro deficits or symptoms concerning for cauda equina at this time. Patient will be discharged with tramadol. He will [facility].  At time of discharge pt became upset at choice of pain meds.  Pt stated to nursing staff "ya'll always give me tramadol and that shit don't work.  I'm here because I'm in pain, I'm not drug seeking or nothin."  Pt advised that he was seen and adequately evaluated by a provider and i did not feel that narcotics were indicated at this time.  Pt continued swearing at nursing staff and was escorted out of ED by security.  He ambulated out unassisted without difficulty.  I personally performed the services described in this documentation, which was scribed in my presence. The recorded information has been reviewed and is accurate.  Garlon HatchetLisa M Sanders, PA-C 08/18/13 1627

## 2013-08-18 NOTE — Discharge Instructions (Signed)
Take the prescribed medication as directed-- recommend taking with tylenol for maximum relief. May apply heat to low back to help with soreness. Follow-up with [facility] if no improvement in the next few days. Return to the ED for new or worsening symptoms.

## 2013-08-18 NOTE — ED Notes (Signed)
Pt reports lower middle back pain that started 1 week ago that is radiating down right leg. No bowel or bladder problems. Pt able to ambulate. Denies fall or injury.

## 2013-10-21 ENCOUNTER — Emergency Department (INDEPENDENT_AMBULATORY_CARE_PROVIDER_SITE_OTHER)
Admission: EM | Admit: 2013-10-21 | Discharge: 2013-10-21 | Disposition: A | Discharge status: Home / Self Care | Payer: BC Managed Care – PPO | Source: Home / Self Care | Attending: Emergency Medicine | Admitting: Emergency Medicine

## 2013-10-21 ENCOUNTER — Encounter (HOSPITAL_COMMUNITY): Payer: Self-pay | Admitting: Emergency Medicine

## 2013-10-21 DIAGNOSIS — X500XXA Overexertion from strenuous movement or load, initial encounter: Secondary | ICD-10-CM

## 2013-10-21 DIAGNOSIS — S4990XA Unspecified injury of shoulder and upper arm, unspecified arm, initial encounter: Secondary | ICD-10-CM

## 2013-10-21 DIAGNOSIS — S4980XA Other specified injuries of shoulder and upper arm, unspecified arm, initial encounter: Secondary | ICD-10-CM

## 2013-10-21 DIAGNOSIS — S4380XA Sprain of other specified parts of unspecified shoulder girdle, initial encounter: Secondary | ICD-10-CM

## 2013-10-21 DIAGNOSIS — Y939 Activity, unspecified: Secondary | ICD-10-CM

## 2013-10-21 DIAGNOSIS — M25512 Pain in left shoulder: Secondary | ICD-10-CM

## 2013-10-21 DIAGNOSIS — S46909A Unspecified injury of unspecified muscle, fascia and tendon at shoulder and upper arm level, unspecified arm, initial encounter: Secondary | ICD-10-CM

## 2013-10-21 DIAGNOSIS — M25519 Pain in unspecified shoulder: Secondary | ICD-10-CM

## 2013-10-21 DIAGNOSIS — S46819A Strain of other muscles, fascia and tendons at shoulder and upper arm level, unspecified arm, initial encounter: Secondary | ICD-10-CM

## 2013-10-21 MED ORDER — KETOROLAC TROMETHAMINE 60 MG/2ML IM SOLN
INTRAMUSCULAR | Status: AC
  Filled 2013-10-21: qty 2

## 2013-10-21 MED ORDER — MELOXICAM 15 MG PO TABS: ORAL_TABLET | ORAL | Status: DC

## 2013-10-21 MED ORDER — KETOROLAC TROMETHAMINE 60 MG/2ML IM SOLN
60.0000 mg | Freq: Once | INTRAMUSCULAR | Status: AC
  Administered 2013-10-21: 60 mg via INTRAMUSCULAR

## 2013-10-21 MED ORDER — TRAMADOL HCL 50 MG PO TABS: 50.0000 mg | ORAL_TABLET | Freq: Four times a day (QID) | ORAL | Status: DC | PRN

## 2013-10-21 NOTE — ED Provider Notes (Signed)
CSN: [id number]     Arrival date & time 10/21/13  1529 History   First MD Initiated Contact with Patient 10/21/13 1702     Chief Complaint  Patient presents with  . Shoulder Injury   (Consider location/radiation/quality/duration/timing/severity/associated sxs/prior Treatment) HPI Comments: Patient presents with pain and "spasm" to left scapular blade. He was lifting a bunk bed yesterday and felt a "pull" in the left upper side. The pain has continued despite Ibuprofen. His pain is dull left upper scapular region, radiates to upper shoulder and neck. No numbness or tingling, no weakness. Pain with ROM and felt "spasms" in the upper back.   Patient is a 28 y.o. male presenting with shoulder injury. The history is provided by the patient.  Shoulder Injury    History reviewed. No pertinent past medical history. Past Surgical History  Procedure Laterality Date  . Tonsillectomy     Family History  Problem Relation Age of Onset  . Diabetes Other   . Hypertension Other    History  Substance Use Topics  . Smoking status: Former Smoker -- 1.00 packs/day    Types: Cigarettes  . Smokeless tobacco: Not on file  . Alcohol Use: Yes     Comment: occasional    Review of Systems  All other systems reviewed and are negative.   Allergies  Review of patient's allergies indicates no known allergies.  Home Medications   Prior to Admission medications   Medication Sig Start Date End Date Taking? Authorizing Provider  meloxicam (MOBIC) 15 MG tablet 1 a day with food x 10 days then as needed for muscle and upper back pain 10/21/13   Riki SheerMichelle G Young, PA-C  naproxen (NAPROSYN) 500 MG tablet Take 1 tablet (500 mg total) by mouth 2 (two) times daily. 05/04/13   Renne CriglerJoshua Geiple, PA-C  traMADol (ULTRAM) 50 MG tablet Take 1 tablet (50 mg total) by mouth every 6 (six) hours as needed. 08/18/13   Garlon HatchetLisa M Sanders, PA-C  traMADol (ULTRAM) 50 MG tablet Take 1 tablet (50 mg total) by mouth every 6 (six) hours  as needed for moderate pain. 10/21/13   Riki SheerMichelle G Young, PA-C   BP 115/68  Pulse 63  Temp(Src) 98.5 F (36.9 C) (Oral)  Resp 16  SpO2 99% Physical Exam  Nursing note and vitals reviewed. Constitutional: He is oriented to person, place, and time. He appears well-developed and well-nourished. No distress.  HENT:  Head: Normocephalic and atraumatic.  Neck: Neck supple.  Full ROM in the neck with poor effort, no spasms in the neck  Pulmonary/Chest: Effort normal.  Musculoskeletal: He exhibits no edema.  Painful and decreased ROM in the left shoulder with poor effort, pain to palpation to multiple areas in the upper back and shoulder  Neurological: He is alert and oriented to person, place, and time. No cranial nerve deficit.  Skin: Skin is warm and dry. He is not diaphoretic.  Psychiatric: His behavior is normal.    ED Course  Procedures (including critical care time) Labs Review Labs Reviewed - No data to display  Imaging Review No results found.   MDM   1. Subscapularis (muscle) sprain and strain   2. Shoulder injury   3. Shoulder pain, left   Exam difficult secondary to patient compliance and poor effort. Otherwise normal ROM, multiple tender points. Requested work note, given. Orthopedic info given for f/u. NSAID's.  Daily with icing, and limited Tramadol as needed.     Riki SheerMichelle G Young, PA-C 10/21/13 1730

## 2013-10-21 NOTE — ED Notes (Signed)
Pt c/o left shoulder inj onset yest Reports he was setting up his kids bunk bed when he felt a pop Pain radiates to his left side of neck and back; increases w/activity Has been taking ibup w/no temp relief Alert w/no signs of acute distress.

## 2013-10-21 NOTE — Discharge Instructions (Signed)
Muscle Strain A muscle strain is an injury that occurs when a muscle is stretched beyond its normal length. Usually a small number of muscle fibers are torn when this happens. Muscle strain is rated in degrees. First-degree strains have the least amount of muscle fiber tearing and pain. Second-degree and third-degree strains have increasingly more tearing and pain.  Usually, recovery from muscle strain takes 1 2 weeks. Complete healing takes 5 6 weeks.  CAUSES  Muscle strain happens when a sudden, violent force placed on a muscle stretches it too far. This may occur with lifting, sports, or a fall.  RISK FACTORS Muscle strain is especially common in athletes.  SIGNS AND SYMPTOMS At the site of the muscle strain, there may be:  Pain.  Bruising.  Swelling.  Difficulty using the muscle due to pain or lack of normal function. DIAGNOSIS  Your health care provider will perform a physical exam and ask about your medical history. TREATMENT  Often, the best treatment for a muscle strain is resting, icing, and applying cold compresses to the injured area.  HOME CARE INSTRUCTIONS   Use the PRICE method of treatment to promote muscle healing during the first 2 3 days after your injury. The PRICE method involves:  Protecting the muscle from being injured again.  Restricting your activity and resting the injured body part.  Icing your injury. To do this, put ice in a plastic bag. Place a towel between your skin and the bag. Then, apply the ice and leave it on from 15 20 minutes each hour. After the third day, switch to moist heat packs.  Apply compression to the injured area with a splint or elastic bandage. Be careful not to wrap it too tightly. This may interfere with blood circulation or increase swelling.  Elevate the injured body part above the level of your heart as often as you can.  Only take over-the-counter or prescription medicines for pain, discomfort, or fever as directed by your  health care provider.  Warming up prior to exercise helps to prevent future muscle strains. SEEK MEDICAL CARE IF:   You have increasing pain or swelling in the injured area.  You have numbness, tingling, or a significant loss of strength in the injured area. MAKE SURE YOU:   Understand these instructions.  Will watch your condition.  Will get help right away if you are not doing well or get worse. Document Released: 04/24/2005 Document Revised: 02/12/2013 Document Reviewed: 11/21/2012 [facility]CExitCare Patient Information 2014 [place]Care, [place].   Ice to area 4x a day-20 min at a time, take Anti-inflammatory RX everyday for 10 days then as needed. Pain medication for severe pain only. F/U with Orthopedics if pain continues

## 2013-10-22 NOTE — ED Provider Notes (Signed)
Medical screening examination/treatment/procedure(s) were performed by non-physician practitioner and as supervising physician I was immediately available for consultation/collaboration.  David Keller, M.D.  David C Keller, MD 10/22/13 1411 

## 2014-05-05 ENCOUNTER — Emergency Department (HOSPITAL_COMMUNITY)
Admission: EM | Admit: 2014-05-05 | Discharge: 2014-05-05 | Disposition: A | Discharge status: Home / Self Care | Payer: BC Managed Care – PPO | Attending: Emergency Medicine | Admitting: Emergency Medicine

## 2014-05-05 ENCOUNTER — Encounter (HOSPITAL_COMMUNITY): Payer: Self-pay

## 2014-05-05 DIAGNOSIS — Z87891 Personal history of nicotine dependence: Secondary | ICD-10-CM | POA: Insufficient documentation

## 2014-05-05 DIAGNOSIS — M545 Low back pain, unspecified: Secondary | ICD-10-CM

## 2014-05-05 MED ORDER — TRAMADOL HCL 50 MG PO TABS
50.0000 mg | ORAL_TABLET | Freq: Four times a day (QID) | ORAL | Status: DC | PRN
Start: 2014-05-05 — End: 2014-12-06

## 2014-05-05 NOTE — Discharge Instructions (Signed)
It was our pleasure to provide your ER care today - we hope that you feel better.  Avoid bending at waist, or heavy lifting > 20 lbs for the next week.  Try heat/heating pad to sore area.   Take motrin as need for pain.  You may also take ultram as need for pain - no driving when taking ultram  Follow up with primary care doctor in week if symptoms fail to improve/resolve.  Return to ER if worse, new symptoms, fevers, numbness/weakness, other concern.    Back Pain, Adult Low back pain is very common. About 1 in 5 people have back pain.The cause of low back pain is rarely dangerous. The pain often gets better over time.About half of people with a sudden onset of back pain feel better in just 2 weeks. About 8 in 10 people feel better by 6 weeks.  CAUSES Some common causes of back pain include:  Strain of the muscles or ligaments supporting the spine.  Wear and tear (degeneration) of the spinal discs.  Arthritis.  Direct injury to the back. DIAGNOSIS Most of the time, the direct cause of low back pain is not known.However, back pain can be treated effectively even when the exact cause of the pain is unknown.Answering your caregiver's questions about your overall health and symptoms is one of the most accurate ways to make sure the cause of your pain is not dangerous. If your caregiver needs more information, he or she may order lab work or imaging tests (X-rays or MRIs).However, even if imaging tests show changes in your back, this usually does not require surgery. HOME CARE INSTRUCTIONS For many people, back pain returns.Since low back pain is rarely dangerous, it is often a condition that people can learn to [facility] Campusmanageon their own.   Remain active. It is stressful on the back to sit or stand in one place. Do not sit, drive, or stand in one place for more than 30 minutes at a time. Take short walks on level surfaces as soon as pain allows.Try to increase the length of time you walk  each day.  Do not stay in bed.Resting more than 1 or 2 days can delay your recovery.  Do not avoid exercise or work.Your body is made to move.It is not dangerous to be active, even though your back may hurt.Your back will likely heal faster if you return to being active before your pain is gone.  Pay attention to your body when you bend and lift. Many people have less discomfortwhen lifting if they bend their knees, keep the load close to their bodies,and avoid twisting. Often, the most comfortable positions are those that put less stress on your recovering back.  Find a comfortable position to sleep. Use a firm mattress and lie on your side with your knees slightly bent. If you lie on your back, put a pillow under your knees.  Only take over-the-counter or prescription medicines as directed by your caregiver. Over-the-counter medicines to reduce pain and inflammation are often the most helpful.Your caregiver may prescribe muscle relaxant drugs.These medicines help dull your pain so you can more quickly return to your normal activities and healthy exercise.  Put ice on the injured area.  Put ice in a plastic bag.  Place a towel between your skin and the bag.  Leave the ice on for 15-20 minutes, 03-04 times a day for the first 2 to 3 days. After that, ice and heat may be alternated to reduce pain and  spasms.  Ask your caregiver about trying back exercises and gentle massage. This may be of some benefit.  Avoid feeling anxious or stressed.Stress increases muscle tension and can worsen back pain.It is important to recognize when you are anxious or stressed and learn ways to manage it.Exercise is a great option. SEEK MEDICAL CARE IF:  You have pain that is not relieved with rest or medicine.  You have pain that does not improve in 1 week.  You have new symptoms.  You are generally not feeling well. SEEK IMMEDIATE MEDICAL CARE IF:   You have pain that radiates from your back  into your legs.  You develop new bowel or bladder control problems.  You have unusual weakness or numbness in your arms or legs.  You develop nausea or vomiting.  You develop abdominal pain.  You feel faint. Document Released: 04/24/2005 Document Revised: 10/24/2011 Document Reviewed: 08/26/2013 [facility] Patient Information 2015 [place], [place]. This information is not intended to replace advice given to you by your health care provider. Make sure you discuss any questions you have with your health care provider.    Lumbosacral Strain Lumbosacral strain is a strain of any of the parts that make up your lumbosacral vertebrae. Your lumbosacral vertebrae are the bones that make up the lower third of your backbone. Your lumbosacral vertebrae are held together by muscles and tough, fibrous tissue (ligaments).  CAUSES  A sudden blow to your back can cause lumbosacral strain. Also, anything that causes an excessive stretch of the muscles in the low back can cause this strain. This is typically seen when people exert themselves strenuously, fall, lift heavy objects, bend, or crouch repeatedly. RISK FACTORS  Physically demanding work.  Participation in pushing or pulling sports or sports that require a sudden twist of the back (tennis, golf, baseball).  Weight lifting.  Excessive lower back curvature.  Forward-tilted pelvis.  Weak back or abdominal muscles or both.  Tight hamstrings. SIGNS AND SYMPTOMS  Lumbosacral strain may cause pain in the area of your injury or pain that moves (radiates) down your leg.  DIAGNOSIS Your health care provider can often diagnose lumbosacral strain through a physical exam. In some cases, you may need tests such as X-ray exams.  TREATMENT  Treatment for your lower back injury depends on many factors that your clinician will have to evaluate. However, most treatment will include the use of anti-inflammatory medicines. HOME CARE INSTRUCTIONS   Avoid  hard physical activities (tennis, racquetball, waterskiing) if you are not in proper physical condition for it. This may aggravate or create problems.  If you have a back problem, avoid sports requiring sudden body movements. Swimming and walking are generally safer activities.  Maintain good posture.  Maintain a healthy weight.  For acute conditions, you may put ice on the injured area.  Put ice in a plastic bag.  Place a towel between your skin and the bag.  Leave the ice on for 20 minutes, 2-3 times a day.  When the low back starts healing, stretching and strengthening exercises may be recommended. SEEK MEDICAL CARE IF:  Your back pain is getting worse.  You experience severe back pain not relieved with medicines. SEEK IMMEDIATE MEDICAL CARE IF:   You have numbness, tingling, weakness, or problems with the use of your arms or legs.  There is a change in bowel or bladder control.  You have increasing pain in any area of the body, including your belly (abdomen).  You notice shortness of  breath, dizziness, or feel faint.  You feel sick to your stomach (nauseous), are throwing up (vomiting), or become sweaty.  You notice discoloration of your toes or legs, or your feet get very cold. MAKE SURE YOU:   Understand these instructions.  Will watch your condition.  Will get help right away if you are not doing well or get worse. Document Released: 02/01/2005 Document Revised: 04/29/2013 Document Reviewed: 12/11/2012 [facility]ExitCare Patient Information 2015 [place], [place]LL[place]. This information is not intended to replace advice given to you by your health care provider. Make sure you discuss any questions you have with your health care provider.    Heat Therapy Heat therapy can help ease sore, stiff, injured, and tight muscles and joints. Heat relaxes your muscles, which may help ease your pain.  RISKS AND COMPLICATIONS If you have any of the following conditions, do not use heat  therapy unless your health care provider has approved:  Poor circulation.  Healing wounds or scarred skin in the area being treated.  Diabetes, heart disease, or high blood pressure.  Not being able to feel (numbness) the area being treated.  Unusual swelling of the area being treated.  Active infections.  Blood clots.  Cancer.  Inability to communicate pain. This may include young children and people who have problems with their brain function (dementia).  Pregnancy. Heat therapy should only be used on old, pre-existing, or long-lasting (chronic) injuries. Do not use heat therapy on new injuries unless directed by your health care provider. HOW TO USE HEAT THERAPY There are several different kinds of heat therapy, including:  Moist heat pack.  Warm water bath.  Hot water bottle.  Electric heating pad.  Heated gel pack.  Heated wrap.  Electric heating pad. Use the heat therapy method suggested by your health care provider. Follow your health care provider's instructions on when and how to use heat therapy. GENERAL HEAT THERAPY RECOMMENDATIONS  Do not sleep while using heat therapy. Only use heat therapy while you are awake.  Your skin may turn pink while using heat therapy. Do not use heat therapy if your skin turns red.  Do not use heat therapy if you have new pain.  High heat or long exposure to heat can cause burns. Be careful when using heat therapy to avoid burning your skin.  Do not use heat therapy on areas of your skin that are already irritated, such as with a rash or sunburn. SEEK MEDICAL CARE IF:  You have blisters, redness, swelling, or numbness.  You have new pain.  Your pain is worse. MAKE SURE YOU:  Understand these instructions.  Will watch your condition.  Will get help right away if you are not doing well or get worse. Document Released: 07/17/2011 Document Revised: 09/08/2013 Document Reviewed: 06/17/2013 [facility]burgExitCare Patient  Information 2015 [place], [place]. This information is not intended to replace advice given to you by your health care provider. Make sure you discuss any questions you have with your health care provider.

## 2014-05-05 NOTE — ED Provider Notes (Signed)
CSN: [id number]     Arrival date & time 05/05/14  [id number] History   First MD Initiated Contact with Patient 05/05/14 0757     Chief Complaint  Patient presents with  . Back Pain     (Consider location/radiation/quality/duration/timing/severity/associated sxs/prior Treatment) Patient is a 28 y.o. male presenting with back pain. The history is provided by the patient.  Back Pain Associated symptoms: no abdominal pain, no chest pain, no dysuria, no fever, no numbness and no weakness   pt c/o low back pain for the past several days. Pain dull, moderate, constant, non radiating, worse w bending at waist or certain positional changes. Hx intermittent back pain in past, no prior back surgery. No radicular pain. No numbness/weakness. Pt states has been doing some bending and lifting. No direct trauma or fall. No fever or chills. No gi or gu c/o.      History reviewed. No pertinent past medical history. Past Surgical History  Procedure Laterality Date  . Tonsillectomy    . Leg surgery     Family History  Problem Relation Age of Onset  . Diabetes Other   . Hypertension Other    History  Substance Use Topics  . Smoking status: Former Smoker -- 1.00 packs/day    Types: Cigarettes  . Smokeless tobacco: Not on file  . Alcohol Use: Yes     Comment: occasional    Review of Systems  Constitutional: Negative for fever and chills.  Respiratory: Negative for cough.   Cardiovascular: Negative for chest pain.  Gastrointestinal: Negative for abdominal pain.  Genitourinary: Negative for dysuria and hematuria.       No urine retention or incont  Musculoskeletal: Positive for back pain. Negative for neck pain.  Neurological: Negative for weakness and numbness.      Allergies  Review of patient's allergies indicates no known allergies.  Home Medications   Prior to Admission medications   Medication Sig Start Date End Date Taking? Authorizing Provider  meloxicam (MOBIC) 15 MG tablet 1 a  day with food x 10 days then as needed for muscle and upper back pain 10/21/13   Riki SheerMichelle G Young, PA-C  naproxen (NAPROSYN) 500 MG tablet Take 1 tablet (500 mg total) by mouth 2 (two) times daily. 05/04/13   Renne CriglerJoshua Geiple, PA-C  traMADol (ULTRAM) 50 MG tablet Take 1 tablet (50 mg total) by mouth every 6 (six) hours as needed. 08/18/13   Garlon HatchetLisa M Sanders, PA-C  traMADol (ULTRAM) 50 MG tablet Take 1 tablet (50 mg total) by mouth every 6 (six) hours as needed for moderate pain. 10/21/13   Riki SheerMichelle G Young, PA-C   BP 126/78 mmHg  Pulse 59  Temp(Src) 98.5 F (36.9 C) (Oral)  Resp 16  SpO2 100% Physical Exam  Constitutional: He is oriented to person, place, and time. He appears well-developed and well-nourished. No distress.  HENT:  Mouth/Throat: Oropharynx is clear and moist.  Eyes: Conjunctivae are normal. No scleral icterus.  Neck: Neck supple. No tracheal deviation present.  Cardiovascular: Normal rate.   Pulmonary/Chest: Effort normal. No accessory muscle usage. No respiratory distress.  Abdominal: Soft. He exhibits no distension. There is no tenderness.  Genitourinary:  No cva tenderness  Musculoskeletal: Normal range of motion. He exhibits no edema or tenderness.  CTLS spine, non tender, aligned, no step off. Lumbar muscular tenderness bil.   Neurological: He is alert and oriented to person, place, and time.  Motor intact bil ext, stre 5/5, sens grossly intact. Steady gait.  Skin: Skin is warm and dry. He is not diaphoretic.  Psychiatric: He has a normal mood and affect.  Nursing note and vitals reviewed.   ED Course  Procedures (including critical care time) Labs Review    MDM   Exam most c/w musculoskeletal strain.  Will try motrin/ultram for pain relief at home.   Reviewed nursing notes and prior charts for additional history.     Suzi RootsKevin E Roselinda Bahena, MD 05/05/14 [phone]

## 2014-05-05 NOTE — ED Notes (Signed)
Pt states back pain since Christmas Day.  Has new infant at home.  States he felt pain soon after bending to change "infant diaper". Pain mainly in lower back but also has stiffness in upper neck. No c/o fever or difficulty urinating.

## 2014-10-05 ENCOUNTER — Emergency Department (HOSPITAL_COMMUNITY): Payer: BLUE CROSS/BLUE SHIELD

## 2014-10-05 ENCOUNTER — Emergency Department (HOSPITAL_COMMUNITY)
Admission: EM | Admit: 2014-10-05 | Discharge: 2014-10-05 | Disposition: A | Discharge status: Home / Self Care | Payer: BLUE CROSS/BLUE SHIELD | Attending: Emergency Medicine | Admitting: Emergency Medicine

## 2014-10-05 ENCOUNTER — Encounter (HOSPITAL_COMMUNITY): Payer: Self-pay | Admitting: Emergency Medicine

## 2014-10-05 DIAGNOSIS — Z87891 Personal history of nicotine dependence: Secondary | ICD-10-CM | POA: Diagnosis not present

## 2014-10-05 DIAGNOSIS — S55101A Unspecified injury of radial artery at forearm level, right arm, initial encounter: Secondary | ICD-10-CM

## 2014-10-05 DIAGNOSIS — S61411A Laceration without foreign body of right hand, initial encounter: Secondary | ICD-10-CM

## 2014-10-05 DIAGNOSIS — W25XXXA Contact with sharp glass, initial encounter: Secondary | ICD-10-CM | POA: Insufficient documentation

## 2014-10-05 DIAGNOSIS — Y9289 Other specified places as the place of occurrence of the external cause: Secondary | ICD-10-CM | POA: Insufficient documentation

## 2014-10-05 DIAGNOSIS — Z791 Long term (current) use of non-steroidal anti-inflammatories (NSAID): Secondary | ICD-10-CM | POA: Insufficient documentation

## 2014-10-05 DIAGNOSIS — Y998 Other external cause status: Secondary | ICD-10-CM | POA: Insufficient documentation

## 2014-10-05 DIAGNOSIS — Z792 Long term (current) use of antibiotics: Secondary | ICD-10-CM | POA: Diagnosis not present

## 2014-10-05 DIAGNOSIS — Z23 Encounter for immunization: Secondary | ICD-10-CM | POA: Insufficient documentation

## 2014-10-05 DIAGNOSIS — Y9389 Activity, other specified: Secondary | ICD-10-CM | POA: Diagnosis not present

## 2014-10-05 DIAGNOSIS — S6991XA Unspecified injury of right wrist, hand and finger(s), initial encounter: Secondary | ICD-10-CM | POA: Diagnosis present

## 2014-10-05 DIAGNOSIS — S4991XA Unspecified injury of right shoulder and upper arm, initial encounter: Secondary | ICD-10-CM

## 2014-10-05 MED ORDER — TETANUS-DIPHTH-ACELL PERTUSSIS 5-2.5-18.5 LF-MCG/0.5 IM SUSP
0.5000 mL | Freq: Once | INTRAMUSCULAR | Status: AC
  Administered 2014-10-05: 0.5 mL via INTRAMUSCULAR
  Filled 2014-10-05: qty 0.5

## 2014-10-05 MED ORDER — LIDOCAINE HCL 1 % IJ SOLN
30.0000 mL | Freq: Once | INTRAMUSCULAR | Status: AC
Start: 2014-10-05 — End: 2014-10-05
  Administered 2014-10-05: 30 mL
  Filled 2014-10-05: qty 40
  Filled 2014-10-05: qty 30

## 2014-10-05 MED ORDER — BACITRACIN ZINC 500 UNIT/GM EX OINT
TOPICAL_OINTMENT | CUTANEOUS | Status: AC
  Administered 2014-10-05: 15:00:00
  Filled 2014-10-05: qty 1.8

## 2014-10-05 MED ORDER — CEPHALEXIN 500 MG PO CAPS: 500.0000 mg | ORAL_CAPSULE | Freq: Two times a day (BID) | ORAL | Status: DC

## 2014-10-05 NOTE — ED Notes (Signed)
Per EMS: Pt states he accidentally put his rt hand through a glass.  Right wrist lac with controlled bleeding.  PMS intact.

## 2014-10-05 NOTE — ED Notes (Signed)
Bed: WTR7 Expected date:  Expected time:  Means of arrival:  Comments: Punched through glass

## 2014-10-05 NOTE — Discharge Instructions (Signed)
Keep wound dry and do not remove dressing for 24 hours if possible. After that, wash gently morning and night (every 12 hours) with soap and water. Use a topical antibiotic ointment and cover with a bandaid or gauze.    Do NOT use rubbing alcohol or hydrogen peroxide, do not soak the area   Present to your primary care doctor or the urgent care of your choice, or the ED for suture removal in 5-7 days.   Every attempt was made to remove foreign body (contaminants) from the wound.  However, there is always a chance that some may remain in the wound. This can  increase your risk of infection.   If you see signs of infection (warmth, redness, tenderness, pus, sharp increase in pain, fever, red streaking in the skin) immediately return to the emergency department.   After the wound heals fully, apply sunscreen for 6-12 months to minimize scarring.   Laceration Care, Adult A laceration is a cut or lesion that goes through all layers of the skin and into the tissue just beneath the skin. TREATMENT  Some lacerations may not require closure. Some lacerations may not be able to be closed due to an increased risk of infection. It is important to see your caregiver as soon as possible after an injury to minimize the risk of infection and maximize the opportunity for successful closure. If closure is appropriate, pain medicines may be given, if needed. The wound will be cleaned to help prevent infection. Your caregiver will use stitches (sutures), staples, wound glue (adhesive), or skin adhesive strips to repair the laceration. These tools bring the skin edges together to allow for faster healing and a better cosmetic outcome. However, all wounds will heal with a scar. Once the wound has healed, scarring can be minimized by covering the wound with sunscreen during the day for 1 full year. HOME CARE INSTRUCTIONS  For sutures or staples:  Keep the wound clean and dry.  If you were given a bandage (dressing),  you should change it at least once a day. Also, change the dressing if it becomes wet or dirty, or as directed by your caregiver.  Wash the wound with soap and water 2 times a day. Rinse the wound off with water to remove all soap. Pat the wound dry with a clean towel.  After cleaning, apply a thin layer of the antibiotic ointment as recommended by your caregiver. This will help prevent infection and keep the dressing from sticking.  You may shower as usual after the first 24 hours. Do not soak the wound in water until the sutures are removed.  Only take over-the-counter or prescription medicines for pain, discomfort, or fever as directed by your caregiver.  Get your sutures or staples removed as directed by your caregiver. For skin adhesive strips:  Keep the wound clean and dry.  Do not get the skin adhesive strips wet. You may bathe carefully, using caution to keep the wound dry.  If the wound gets wet, pat it dry with a clean towel.  Skin adhesive strips will fall off on their own. You may trim the strips as the wound heals. Do not remove skin adhesive strips that are still stuck to the wound. They will fall off in time. For wound adhesive:  You may briefly wet your wound in the shower or bath. Do not soak or scrub the wound. Do not swim. Avoid periods of heavy perspiration until the skin adhesive has fallen off on  its own. After showering or bathing, gently pat the wound dry with a clean towel.  Do not apply liquid medicine, cream medicine, or ointment medicine to your wound while the skin adhesive is in place. This may loosen the film before your wound is healed.  If a dressing is placed over the wound, be careful not to apply tape directly over the skin adhesive. This may cause the adhesive to be pulled off before the wound is healed.  Avoid prolonged exposure to sunlight or tanning lamps while the skin adhesive is in place. Exposure to ultraviolet light in the first year will  darken the scar.  The skin adhesive will usually remain in place for 5 to 10 days, then naturally fall off the skin. Do not pick at the adhesive film. You may need a tetanus shot if:  You cannot remember when you had your last tetanus shot.  You have never had a tetanus shot. If you get a tetanus shot, your arm may swell, get red, and feel warm to the touch. This is common and not a problem. If you need a tetanus shot and you choose not to have one, there is a rare chance of getting tetanus. Sickness from tetanus can be serious. SEEK MEDICAL CARE IF:   You have redness, swelling, or increasing pain in the wound.  You see a red line that goes away from the wound.  You have yellowish-white fluid (pus) coming from the wound.  You have a fever.  You notice a bad smell coming from the wound or dressing.  Your wound breaks open before or after sutures have been removed.  You notice something coming out of the wound such as wood or glass.  Your wound is on your hand or foot and you cannot move a finger or toe. SEEK IMMEDIATE MEDICAL CARE IF:   Your pain is not controlled with prescribed medicine.  You have severe swelling around the wound causing pain and numbness or a change in color in your arm, hand, leg, or foot.  Your wound splits open and starts bleeding.  You have worsening numbness, weakness, or loss of function of any joint around or beyond the wound.  You develop painful lumps near the wound or on the skin anywhere on your body. MAKE SURE YOU:   Understand these instructions.  Will watch your condition.  Will get help right away if you are not doing well or get worse. Document Released: 04/24/2005 Document Revised: 07/17/2011 Document Reviewed: 10/18/2010 [facility] Patient Information 2015 [employer], [place]. This information is not intended to replace advice given to you by your health care provider. Make sure you discuss any questions you have with your health care  provider.

## 2014-10-05 NOTE — ED Notes (Signed)
Pt and girlfriend of pt called the hospital to speak with this RN about the incident. Pt was attempting to speak with this RN on the phone about why security had been called to assist this patient out of the department. Girlfriend was screaming in the background of the phone call "fuck that bitch". Explained to pt that security was called because pt was in the hallway screaming at the staff and was not able to be redirected in any manner. This RN could not continue to speak with pt on the phone because he was talking over this RN and was not able to hold a conversation. Girlfriend was screaming so loud in the background that RN could barely make out what pt was saying. During the incident, Alaina RN, security, and [employer] were present as pt was seen screaming at this RN in the hallway. Pt also noted to have ETOH on board.

## 2014-10-05 NOTE — ED Notes (Signed)
Pt given some wrapping supplies for his wound to dress at home. As this RN left the room to get more supplies, upon return to the room pt was taking other supplies from around the room (washcloth, 4 x 4's) and placing them into a patient belongings bag. Explained to pt that I would get him the appropriate supplies but that he could not take other supplies from around the room. Pt started to raise his voice and yell at this RN. Security called to bedside as patient continued to escalate. Pt asked to leave the department as he was continuing to raise his voice and aggressively yell at staff. Pt screaming all the way out of the department escorted by [employer] and security.

## 2014-10-05 NOTE — ED Provider Notes (Signed)
CSN: [id number]     Arrival date & time 10/05/14  1226 History  This chart was scribed for non-physician practitioner Karmen Stabsori Zidan Helget, PA-C, working with Geoffery Lyonsouglas Delo, MD, by Andrew Auaven Small, ED Scribe. This patient was seen in room WTR7/WTR7 and the patient's care was started at 12:50PM.   Chief Complaint  Patient presents with  . Hand Pain   The history is provided by the patient. No language interpreter was used.   Michael Garza is a 29 y.o. male brought in by ambulance who presents to the Emergency Department complaining of right hand injury that occurred about 2 hours ago. Pt states he struck right hand through a house window unintentionally, shattering the glass. Pt now has right hand laceration with uncontrolled bleeding and right hand pain. Pt unable to describe pain. He has not taken anything for the pain. Pain worse with movement. Pt tetanus is not UTD. Pt denies numbness and tingling. Pt admits to alcohol use.  History reviewed. No pertinent past medical history. Past Surgical History  Procedure Laterality Date  . Tonsillectomy    . Leg surgery     Family History  Problem Relation Age of Onset  . Diabetes Other   . Hypertension Other    History  Substance Use Topics  . Smoking status: Former Smoker -- 1.00 packs/day    Types: Cigarettes  . Smokeless tobacco: Not on file  . Alcohol Use: Yes     Comment: occasional    Review of Systems  Musculoskeletal: Positive for myalgias and arthralgias.  Skin: Positive for wound. Negative for color change.  Neurological: Negative for weakness and numbness.   Allergies  Review of patient's allergies indicates no known allergies.  Home Medications   Prior to Admission medications   Medication Sig Start Date End Date Taking? Authorizing Provider  cephALEXin (KEFLEX) 500 MG capsule Take 1 capsule (500 mg total) by mouth 2 (two) times daily. 10/05/14   Oswaldo ConroyVictoria Annaleah Arata, PA-C  meloxicam (MOBIC) 15 MG tablet 1 a day with food x 10 days then as  needed for muscle and upper back pain 10/21/13   Riki SheerMichelle G Young, PA-C  naproxen (NAPROSYN) 500 MG tablet Take 1 tablet (500 mg total) by mouth 2 (two) times daily. 05/04/13   Renne CriglerJoshua Geiple, PA-C  traMADol (ULTRAM) 50 MG tablet Take 1 tablet (50 mg total) by mouth every 6 (six) hours as needed. 08/18/13   Garlon HatchetLisa M Sanders, PA-C  traMADol (ULTRAM) 50 MG tablet Take 1 tablet (50 mg total) by mouth every 6 (six) hours as needed for moderate pain. 10/21/13   Riki SheerMichelle G Young, PA-C  traMADol (ULTRAM) 50 MG tablet Take 1 tablet (50 mg total) by mouth every 6 (six) hours as needed. 05/05/14   Cathren LaineKevin Steinl, MD   BP 140/96 mmHg  Pulse 97  Temp(Src) 98.1 F (36.7 C) (Oral)  Resp 20  SpO2 97% Physical Exam  Constitutional: He appears well-developed and well-nourished. No distress.  Patient not clinically intoxicated and is cooperative  HENT:  Head: Normocephalic and atraumatic.  Eyes: Conjunctivae are normal. Right eye exhibits no discharge. Left eye exhibits no discharge.  Cardiovascular:  Pulses:      Radial pulses are 2+ on the right side.  Pulmonary/Chest: Effort normal. No respiratory distress.  Neurological: He is alert. Coordination normal.  Pt moves all fingers with intact flexion and extension. Sensation intact.   Skin: He is not diaphoretic.  3cm linear laceration to right lateral thumb. 3cm linear laceration right wrist . 1cm  linear laceration over radial artery. 6cm linear laceration and 3-4cm liner laceration to distal forearm ulnar side. <3 sec cap refill. No cyanosis.     Psychiatric: He has a normal mood and affect. His behavior is normal.  Nursing note and vitals reviewed.   ED Course  Procedures  LACERATION REPAIR PROCEDURE NOTE The patient's identification was confirmed and consent was obtained. This procedure was performed by Karmen Stabs, PA-C at 3:11 PM. Site: right radial Sterile procedures observed: yes Anesthetic used (type and amt): lidocaine 1% without epi Suture  type/size: 4-0 proline Length: multiple linear lacerations varying from 1cm to 6cm. 1 cm laceration over radial artery  # of Sutures: 7 Technique: running and simple interrupted Complexity simple Antibx ointment applied Tetanus  ordered Site anesthetized, irrigated with NS, explored without evidence of foreign body, wound well approximated, site covered with dry, sterile dressing.  Patient tolerated procedure well without complications. Instructions for care discussed verbally and patient provided with additional written instructions for homecare and f/u.  DIAGNOSTIC STUDIES: Oxygen Saturation is 97% on RA, normal by my interpretation.    COORDINATION OF CARE: 1:06 PM- Pt advised of plan for treatment witch includes sutures and pt agrees.  Labs Review Labs Reviewed - No data to display  Imaging Review Dg Forearm Right  10/05/2014   CLINICAL DATA:  Trauma as patient put hand through window.  EXAM: RIGHT FOREARM - 2 VIEW  COMPARISON:  None.  FINDINGS: There is an external bandage over the radial side of the wrist. Soft tissue laceration over the anterior soft tissues to of the distal forearm. There is no evidence of radiopaque foreign body. There is no acute fracture or dislocation.  IMPRESSION: No acute fracture or foreign body.   Electronically Signed   By: Elberta Fortis M.D.   On: 10/05/2014 14:05   Dg Hand Complete Right  10/05/2014   CLINICAL DATA:  Right hand pain post trauma sticking hand through window.  EXAM: RIGHT HAND - COMPLETE 3+ VIEW  COMPARISON:  12/14/2007  FINDINGS: External bandage is present over the wrist. Bony structures and joint spaces are within normal. There is no fracture, dislocation or foreign body.  IMPRESSION: No acute fracture or foreign body.   Electronically Signed   By: Elberta Fortis M.D.   On: 10/05/2014 14:03     EKG Interpretation None      MDM   Final diagnoses:  Arm injury, right, initial encounter  Radial artery injury, right, initial  encounter  Hand laceration, right, initial encounter   Patient presenting with multiple lacerations to right hand. Multiple glass foreign bodies removed. Bleeding controlled during evaluation. Neurovascularly intact. After evaluation patient had arterial bleed likely radial. Bleeding controlled with direct pressure. Consult to vascular surgery. Dr. Judd Lien spoke with Dr. Imogene Burn who stated apply direct pressure and repair with sutures. No further intervention. Patient had no further extensive bleeding or arterial bleeding NAD. Laceration performed without immediate complications. Patient's tetanus updated. Patient to follow-up with urgent care for reevaluation and suture removal in 5-7 days. Patient placed in a splint. Extensive conversation about laceration care and patient verbalizes agreement. Discussed strict return precautions including infection.  Discussed return precautions with patient. Discussed all results and patient verbalizes understanding and agrees with plan.  This is a shared patient. This patient was discussed with the physician who saw and evaluated the patient and agrees with the plan.  I personally performed the services described in this documentation, which was scribed in my presence. The recorded  information has been reviewed and is accurate.   Oswaldo Conroy, PA-C 10/05/14 [address], [place] 10/05/14 1650  Geoffery Lyons, MD 10/06/14 [phone]

## 2014-10-06 ENCOUNTER — Encounter (HOSPITAL_COMMUNITY): Payer: Self-pay | Admitting: Family Medicine

## 2014-10-06 ENCOUNTER — Emergency Department (INDEPENDENT_AMBULATORY_CARE_PROVIDER_SITE_OTHER)
Admission: EM | Admit: 2014-10-06 | Discharge: 2014-10-06 | Disposition: A | Discharge status: Home / Self Care | Payer: BLUE CROSS/BLUE SHIELD | Source: Home / Self Care

## 2014-10-06 DIAGNOSIS — S55101D Unspecified injury of radial artery at forearm level, right arm, subsequent encounter: Secondary | ICD-10-CM

## 2014-10-06 DIAGNOSIS — S61411D Laceration without foreign body of right hand, subsequent encounter: Secondary | ICD-10-CM | POA: Diagnosis not present

## 2014-10-06 MED ORDER — IBUPROFEN 800 MG PO TABS: 800.0000 mg | ORAL_TABLET | Freq: Three times a day (TID) | ORAL | Status: DC | PRN

## 2014-10-06 NOTE — ED Notes (Signed)
Pt seen in ED last night for wrist injury/laceration.   Here for second opinion.

## 2014-10-06 NOTE — ED Notes (Signed)
WOUND CLEANED WITH NORMAL SALINE AND COVERED IN BACITRACIN. WOUND COVERED WITH 3" KLING. PATIENT GIVEN CARE INSTRUCTIONS AND FOLLOW UP INSTRUCTIONS.

## 2014-10-06 NOTE — Discharge Instructions (Signed)
Your injuries appear to be well healing. Please keep the area protected. Please come back to her stitches taken out on June 9 or 10th. Please wash the area daily with soapy water, pat the area dry, apply anabolic ointment and a new bandage. Do this until he had get the stitches out. She did take antibiotics. He may use ibuprofen 800 for additional pain relief.

## 2014-10-06 NOTE — ED Provider Notes (Signed)
CSN: [id number]     Arrival date & time 10/06/14  1146 History   None    Chief Complaint  Patient presents with  . Wound Check   (Consider location/radiation/quality/duration/timing/severity/associated sxs/prior Treatment) HPI  She suffered extensive hand and radial artery lacerations one day prior to presenting to urgent care for evaluation. Patient was evaluated by vascular surgery and had his wounds repaired at that time. Patient is presenting today with hand pain and states that he was not given any wound care instructions. Patient states that his wounds are very painful and is not any worse than after the initial injury. States he has no purulent discharge out of his wounds. Patient states he's been taking his Keflex as prescribed. Patient states he has sensation in his hand. Denies fevers, loss of strength or sensation.   History reviewed. No pertinent past medical history. Past Surgical History  Procedure Laterality Date  . Tonsillectomy    . Leg surgery     Family History  Problem Relation Age of Onset  . Diabetes Other   . Hypertension Other    History  Substance Use Topics  . Smoking status: Former Smoker -- 1.00 packs/day    Types: Cigarettes  . Smokeless tobacco: Not on file  . Alcohol Use: Yes     Comment: occasional    Review of Systems Per HPI with all other pertinent systems negative.   Allergies  Review of patient's allergies indicates no known allergies.  Home Medications   Prior to Admission medications   Medication Sig Start Date End Date Taking? Authorizing Provider  cephALEXin (KEFLEX) 500 MG capsule Take 1 capsule (500 mg total) by mouth 2 (two) times daily. 10/05/14   Oswaldo ConroyVictoria Creech, PA-C  ibuprofen (ADVIL,MOTRIN) 800 MG tablet Take 1 tablet (800 mg total) by mouth every 8 (eight) hours as needed. 10/06/14   Ozella Rocksavid J Merrell, MD  meloxicam (MOBIC) 15 MG tablet 1 a day with food x 10 days then as needed for muscle and upper back pain 10/21/13    Riki SheerMichelle G Young, PA-C  naproxen (NAPROSYN) 500 MG tablet Take 1 tablet (500 mg total) by mouth 2 (two) times daily. 05/04/13   Renne CriglerJoshua Geiple, PA-C  traMADol (ULTRAM) 50 MG tablet Take 1 tablet (50 mg total) by mouth every 6 (six) hours as needed. 08/18/13   Garlon HatchetLisa M Sanders, PA-C  traMADol (ULTRAM) 50 MG tablet Take 1 tablet (50 mg total) by mouth every 6 (six) hours as needed for moderate pain. 10/21/13   Riki SheerMichelle G Young, PA-C  traMADol (ULTRAM) 50 MG tablet Take 1 tablet (50 mg total) by mouth every 6 (six) hours as needed. 05/05/14   Cathren LaineKevin Steinl, MD   BP 94/77 mmHg  Pulse 59  Temp(Src) 98.9 F (37.2 C) (Oral)  Resp 16  SpO2 98% Physical Exam Physical Exam  Constitutional: oriented to person, place, and time. appears well-developed and well-nourished. No distress.  HENT:  Head: Normocephalic and atraumatic.  Eyes: EOMI. PERRL.  Neck: Normal range of motion.  Cardiovascular: RRR, no m/r/g, right radial pulses present proximally and distally to radial repair site. Pulmonary/Chest: Effort normal and breath sounds normal. No respiratory distress.  Abdominal: Soft. Bowel sounds are normal. NonTTP, no distension.  Musculoskeletal: No effusions, or head movement intact..  Neurological: alert and oriented to person, place, and time.  Skin: Lacerations repaired as in previous note.   no evidence of significant erythema induration or discharge.  Psychiatric: normal mood and affect. behavior is normal. Judgment and  thought content normal.   ED Course  Procedures (including critical care time) Labs Review Labs Reviewed - No data to display  Imaging Review Dg Forearm Right  10/05/2014   CLINICAL DATA:  Trauma as patient put hand through window.  EXAM: RIGHT FOREARM - 2 VIEW  COMPARISON:  None.  FINDINGS: There is an external bandage over the radial side of the wrist. Soft tissue laceration over the anterior soft tissues to of the distal forearm. There is no evidence of radiopaque foreign body.  There is no acute fracture or dislocation.  IMPRESSION: No acute fracture or foreign body.   Electronically Signed   By: Elberta Fortis M.D.   On: 10/05/2014 14:05   Dg Hand Complete Right  10/05/2014   CLINICAL DATA:  Right hand pain post trauma sticking hand through window.  EXAM: RIGHT HAND - COMPLETE 3+ VIEW  COMPARISON:  12/14/2007  FINDINGS: External bandage is present over the wrist. Bony structures and joint spaces are within normal. There is no fracture, dislocation or foreign body.  IMPRESSION: No acute fracture or foreign body.   Electronically Signed   By: Elberta Fortis M.D.   On: 10/05/2014 14:03     MDM   1. Radial artery injury, right, subsequent encounter   2. Hand laceration, right, subsequent encounter    No evidence of renal artery compromise.  Wounds cleaned with normal saline and antibiotic ointment and external bandage applied. Detailed wound care instructions provided to patient. Continue Keflex. Patient to return in 10 days for suture removal. Patient will have significant difficulty working over this period of time as he works loading trucks for [employer]. Anticipate patient can work in a light-duty capacity during this period of time so as it does not involve the right arm and wrist.      Ozella Rocks, MD 10/06/14 [phone]

## 2014-10-15 ENCOUNTER — Encounter (HOSPITAL_COMMUNITY): Payer: Self-pay | Admitting: *Deleted

## 2014-10-15 ENCOUNTER — Emergency Department (INDEPENDENT_AMBULATORY_CARE_PROVIDER_SITE_OTHER)
Admission: EM | Admit: 2014-10-15 | Discharge: 2014-10-15 | Disposition: A | Discharge status: Home / Self Care | Payer: BLUE CROSS/BLUE SHIELD | Source: Home / Self Care | Attending: Family Medicine | Admitting: Family Medicine

## 2014-10-15 DIAGNOSIS — Z4802 Encounter for removal of sutures: Secondary | ICD-10-CM

## 2014-10-15 DIAGNOSIS — L7682 Other postprocedural complications of skin and subcutaneous tissue: Secondary | ICD-10-CM

## 2014-10-15 DIAGNOSIS — R208 Other disturbances of skin sensation: Secondary | ICD-10-CM | POA: Diagnosis not present

## 2014-10-15 MED ORDER — DICLOFENAC SODIUM 75 MG PO TBEC: 75.0000 mg | DELAYED_RELEASE_TABLET | Freq: Two times a day (BID) | ORAL | Status: DC

## 2014-10-15 MED ORDER — LIDOCAINE HCL 2 % EX GEL: 1.0000 "application " | Freq: Three times a day (TID) | CUTANEOUS | Status: DC | PRN

## 2014-10-15 NOTE — ED Provider Notes (Signed)
CSN: [id number]     Arrival date & time 10/15/14  1403 History   First MD Initiated Contact with Patient 10/15/14 1509     No chief complaint on file.  (Consider location/radiation/quality/duration/timing/severity/associated sxs/prior Treatment) HPI  Right wrist lacerations. Patient also sustained a right radial laceration. This was repaired in the ED. Patient was seen in urgent care several days ago at this point it was too early to take the sutures out. Patient was placed in a thumb spica splint given very detailed wound care instructions and told to continue his anabolic. At this point time patient is now 10 days out from his injury. Sutures were removed. No evidence of infection. Radial artery is palpable proximally and distally to the injury. Patient sensation and movement are intact. Patient does endorse a significant amount of pain around the incision sites. Patient stating that his ibuprofen 800 does not work long enough for the pain.  No past medical history on file. Past Surgical History  Procedure Laterality Date  . Tonsillectomy    . Leg surgery     Family History  Problem Relation Age of Onset  . Diabetes Other   . Hypertension Other    History  Substance Use Topics  . Smoking status: Former Smoker -- 1.00 packs/day    Types: Cigarettes  . Smokeless tobacco: Not on file  . Alcohol Use: Yes     Comment: occasional    Review of Systems  Allergies  Review of patient's allergies indicates no known allergies.  Home Medications   Prior to Admission medications   Medication Sig Start Date End Date Taking? Authorizing Provider  cephALEXin (KEFLEX) 500 MG capsule Take 1 capsule (500 mg total) by mouth 2 (two) times daily. 10/05/14   Oswaldo Conroy, PA-C  diclofenac (VOLTAREN) 75 MG EC tablet Take 1 tablet (75 mg total) by mouth 2 (two) times daily. 10/15/14   Ozella Rocks, MD  ibuprofen (ADVIL,MOTRIN) 800 MG tablet Take 1 tablet (800 mg total) by mouth every 8 (eight)  hours as needed. 10/06/14   Ozella Rocks, MD  lidocaine (XYLOCAINE) 2 % jelly Apply 1 application topically 3 (three) times daily as needed. 10/15/14   Ozella Rocks, MD  meloxicam (MOBIC) 15 MG tablet 1 a day with food x 10 days then as needed for muscle and upper back pain 10/21/13   Riki Sheer, PA-C  naproxen (NAPROSYN) 500 MG tablet Take 1 tablet (500 mg total) by mouth 2 (two) times daily. 05/04/13   Renne Crigler, PA-C  traMADol (ULTRAM) 50 MG tablet Take 1 tablet (50 mg total) by mouth every 6 (six) hours as needed. 08/18/13   Garlon Hatchet, PA-C  traMADol (ULTRAM) 50 MG tablet Take 1 tablet (50 mg total) by mouth every 6 (six) hours as needed for moderate pain. 10/21/13   Riki Sheer, PA-C  traMADol (ULTRAM) 50 MG tablet Take 1 tablet (50 mg total) by mouth every 6 (six) hours as needed. 05/05/14   Cathren Laine, MD   BP 125/80 mmHg  Pulse 61  Temp(Src) 98.5 F (36.9 C) (Oral)  Resp 16  SpO2 100% Physical Exam Physical Exam  Constitutional: oriented to person, place, and time. appears well-developed and well-nourished. No distress.  HENT:  Head: Normocephalic and atraumatic.  Eyes: EOMI. PERRL.  Neck: Normal range of motion.  Cardiovascular: RRR, no m/r/g, 2+ distal pulses,  Pulmonary/Chest: Effort normal and breath sounds normal. No respiratory distress.  Abdominal: Soft. Bowel sounds are normal.  NonTTP, no distension.  Musculoskeletal: Normal range of motion. Non ttp, no effusion.  Neurological: alert and oriented to person, place, and time.  Skin: Right wrist lacerations with 3-0 Prolene sutures in place. Removed. No evidence of infection.  Psychiatric: normal mood and affect. behavior is normal. Judgment and thought content normal.   ED Course  Procedures (including critical care time) Labs Review Labs Reviewed - No data to display  Imaging Review No results found.   MDM   1. Visit for suture removal   2. Incisional pain    Patient has lost his thumb  spica splint this was given to the patient again as he'll need this for protection as he works for [employer]. Discussed how patient's wound is likely now this weakest point now that the sutures have been removed and recommended he avoid any direct contact or heavy lifting with that arm for another 2 weeks. Patient to seek additional medical care from his primary care physician as well as any work notes related to additional time off from work. Patient to use anabolic ointment for the next 1-2 days. Patient given prescription for both parents as well as for lidocaine gel for additional pain relief.    Ozella Rocks, MD 10/15/14 [phone]

## 2014-10-15 NOTE — Discharge Instructions (Signed)
Sutures were removed today. The wound appears to be healing very very well. Please use the wrist splint for an additional 2 weeks. After this point you should go to return to your normal physical activity without concern for reinjuring your arm. Please use the Voltaren in place of ibuprofen. Please use the lidocaine gel if she need additional pain relief. Please apply antibiotic ointment to your wounds for the next 2 days.

## 2014-10-15 NOTE — ED Notes (Signed)
Left thumb suture removal

## 2014-12-06 ENCOUNTER — Emergency Department (INDEPENDENT_AMBULATORY_CARE_PROVIDER_SITE_OTHER)
Admission: EM | Admit: 2014-12-06 | Discharge: 2014-12-06 | Disposition: A | Discharge status: Home / Self Care | Payer: BLUE CROSS/BLUE SHIELD | Source: Home / Self Care | Attending: Family Medicine | Admitting: Family Medicine

## 2014-12-06 ENCOUNTER — Encounter (HOSPITAL_COMMUNITY): Payer: Self-pay | Admitting: Emergency Medicine

## 2014-12-06 ENCOUNTER — Emergency Department (INDEPENDENT_AMBULATORY_CARE_PROVIDER_SITE_OTHER): Payer: BLUE CROSS/BLUE SHIELD

## 2014-12-06 DIAGNOSIS — M79671 Pain in right foot: Secondary | ICD-10-CM | POA: Diagnosis not present

## 2014-12-06 DIAGNOSIS — S90121A Contusion of right lesser toe(s) without damage to nail, initial encounter: Secondary | ICD-10-CM | POA: Diagnosis not present

## 2014-12-06 DIAGNOSIS — M79641 Pain in right hand: Secondary | ICD-10-CM | POA: Diagnosis not present

## 2014-12-06 MED ORDER — IBUPROFEN 800 MG PO TABS: 800.0000 mg | ORAL_TABLET | Freq: Three times a day (TID) | ORAL | Status: DC | PRN

## 2014-12-06 MED ORDER — TRAMADOL HCL 50 MG PO TABS: 50.0000 mg | ORAL_TABLET | Freq: Four times a day (QID) | ORAL | Status: DC | PRN

## 2014-12-06 NOTE — ED Provider Notes (Signed)
CSN: [id number]     Arrival date & time 12/06/14  1506 History   First MD Initiated Contact with Patient 12/06/14 1530     Chief Complaint  Patient presents with  . Toe Injury   (Consider location/radiation/quality/duration/timing/severity/associated sxs/prior Treatment) HPI Comments: Michael Garza is 29 yo black male who presents today right right great toe pain. He reports pain while playing basketball yesterday, but does not recall the specific injury. The toe has been "red and painful" since yesterday. Pain to bear weight and swelling. He also notes the right hand and wrist are still painful from the laceration he sustained in June (see encounter). No fractures at that time. He reports continued soreness and "burning". No decreased in ROM.   The history is provided by the patient.    History reviewed. No pertinent past medical history. Past Surgical History  Procedure Laterality Date  . Tonsillectomy    . Leg surgery     Family History  Problem Relation Age of Onset  . Diabetes Other   . Hypertension Other    History  Substance Use Topics  . Smoking status: Former Smoker -- 1.00 packs/day    Types: Cigarettes  . Smokeless tobacco: Not on file  . Alcohol Use: Yes     Comment: occasional    Review of Systems  Constitutional: Negative for fever.  Musculoskeletal: Positive for joint swelling and arthralgias.  Skin: Negative.   All other systems reviewed and are negative.   Allergies  Review of patient's allergies indicates no known allergies.  Home Medications   Prior to Admission medications   Medication Sig Start Date End Date Taking? Authorizing Provider  cephALEXin (KEFLEX) 500 MG capsule Take 1 capsule (500 mg total) by mouth 2 (two) times daily. 10/05/14   Oswaldo Conroy, PA-C  ibuprofen (ADVIL,MOTRIN) 800 MG tablet Take 1 tablet (800 mg total) by mouth every 8 (eight) hours as needed. 12/06/14   Riki Sheer, PA-C  lidocaine (XYLOCAINE) 2 % jelly Apply 1  application topically 3 (three) times daily as needed. 10/15/14   Ozella Rocks, MD  traMADol (ULTRAM) 50 MG tablet Take 1 tablet (50 mg total) by mouth every 6 (six) hours as needed. 12/06/14   Riki Sheer, PA-C   BP 109/72 mmHg  Pulse 75  Temp(Src) 98 F (36.7 C) (Oral)  Resp 18  SpO2 99% Physical Exam  Constitutional: He is oriented to person, place, and time. He appears well-developed and well-nourished. No distress.  HENT:  Head: Normocephalic and atraumatic.  Musculoskeletal:  Right hand and wrist with healed laceration, no swelling is noted. No deformity. Full ROM. Tenderness along any aspect of the hands including the scars. Right great toe with localized swelling, tender to touch at any aspect of the toe or fore foot. Mild warmth is noted, no erythema.  Neurological: He is alert and oriented to person, place, and time.  Skin: He is not diaphoretic.  Psychiatric: His behavior is normal.  Nursing note and vitals reviewed.   ED Course  Procedures (including critical care time) Labs Review Labs Reviewed - No data to display  Imaging Review Dg Foot Complete Right  12/06/2014   CLINICAL DATA:  Rolled foot, basketball injury, pain/swelling  EXAM: RIGHT FOOT COMPLETE - 3+ VIEW  COMPARISON:  None.  FINDINGS: No fracture or dislocation is seen.  The joint spaces are preserved.  Visualized soft tissues are within normal limits.  IMPRESSION: No fracture or dislocation is seen.   Electronically Signed  By: Charline Bills M.D.   On: 12/06/2014 15:49     MDM   1. Toe contusion, right, initial encounter   2. Foot pain, right   3. Hand pain, right    1. Post op shoe, rest, ice, NSAID's and refill on Tramadol to use sparingly. Work note x 2 days.  3. Referral to Dr. Mina Marble for continued pain in the hand following laceration in June ? Neuralgia.    Riki Sheer, PA-C 12/06/14 [phone]

## 2014-12-06 NOTE — ED Notes (Signed)
Pt states that he was playing basketball and injured his great toe on the right foot yesterday.

## 2014-12-06 NOTE — Discharge Instructions (Signed)
Contusion A contusion is a deep bruise. Contusions happen when an injury causes bleeding under the skin. Signs of bruising include pain, puffiness (swelling), and discolored skin. The contusion may turn blue, purple, or yellow. HOME CARE   Put ice on the injured area.  Put ice in a plastic bag.  Place a towel between your skin and the bag.  Leave the ice on for 15-20 minutes, 03-04 times a day.  Only take medicine as told by your doctor.  Rest the injured area.  If possible, raise (elevate) the injured area to lessen puffiness. GET HELP RIGHT AWAY IF:   You have more bruising or puffiness.  You have pain that is getting worse.  Your puffiness or pain is not helped by medicine. MAKE SURE YOU:   Understand these instructions.  Will watch your condition.  Will get help right away if you are not doing well or get worse. Document Released: 10/11/2007 Document Revised: 07/17/2011 Document Reviewed: 02/27/2011 [facility] Patient Information 2015 [place], [place]. This information is not intended to replace advice given to you by your health care provider. Make sure you discuss any questions you have with your health care provider.     No fractures in your foot. Ok to wear post op shoe for comfort up to 2 weeks if needed. Use the Ibuprofen daily for pain and swelling. Still use ice for swelling as well. I have given you the name of the hand sugeon on call. Call to make a f/u appt.

## 2015-03-03 ENCOUNTER — Encounter (HOSPITAL_COMMUNITY): Payer: Self-pay | Admitting: *Deleted

## 2015-03-03 ENCOUNTER — Emergency Department (HOSPITAL_COMMUNITY): Payer: BLUE CROSS/BLUE SHIELD

## 2015-03-03 ENCOUNTER — Emergency Department (HOSPITAL_COMMUNITY)
Admission: EM | Admit: 2015-03-03 | Discharge: 2015-03-03 | Disposition: A | Discharge status: Home / Self Care | Payer: BLUE CROSS/BLUE SHIELD | Attending: Emergency Medicine | Admitting: Emergency Medicine

## 2015-03-03 DIAGNOSIS — F1410 Cocaine abuse, uncomplicated: Secondary | ICD-10-CM | POA: Diagnosis not present

## 2015-03-03 DIAGNOSIS — M542 Cervicalgia: Secondary | ICD-10-CM | POA: Diagnosis present

## 2015-03-03 DIAGNOSIS — R1030 Lower abdominal pain, unspecified: Secondary | ICD-10-CM | POA: Diagnosis not present

## 2015-03-03 DIAGNOSIS — Z87891 Personal history of nicotine dependence: Secondary | ICD-10-CM | POA: Diagnosis not present

## 2015-03-03 DIAGNOSIS — F1210 Cannabis abuse, uncomplicated: Secondary | ICD-10-CM | POA: Insufficient documentation

## 2015-03-03 DIAGNOSIS — M5412 Radiculopathy, cervical region: Secondary | ICD-10-CM | POA: Insufficient documentation

## 2015-03-03 LAB — BASIC METABOLIC PANEL
ANION GAP: 9 (ref 5–15)
BUN: 6 mg/dL (ref 6–20)
CO2: 27 mmol/L (ref 22–32)
Calcium: 9.1 mg/dL (ref 8.9–10.3)
Chloride: 100 mmol/L — ABNORMAL LOW (ref 101–111)
Creatinine, Ser: 0.96 mg/dL (ref 0.61–1.24)
GFR calc Af Amer: >60 mL/min (ref >60)
GFR calc non Af Amer: >60 mL/min (ref >60)
GLUCOSE: 82 mg/dL (ref 65–99)
Potassium: 3.8 mmol/L (ref 3.5–5.1)
Sodium: 136 mmol/L (ref 135–145)

## 2015-03-03 LAB — CBC WITH DIFFERENTIAL/PLATELET
BASOS ABS: 0 10*3/uL (ref 0.0–0.1)
Basophils Relative: 0 %
Eosinophils Absolute: 0.2 10*3/uL (ref 0.0–0.7)
Eosinophils Relative: 2 %
HEMATOCRIT: 45.1 % (ref 39.0–52.0)
Hemoglobin: 14.8 g/dL (ref 13.0–17.0)
LYMPHS ABS: 1 10*3/uL (ref 0.7–4.0)
LYMPHS PCT: 9 %
MCH: 28.7 pg (ref 26.0–34.0)
MCHC: 32.8 g/dL (ref 30.0–36.0)
MCV: 87.4 fL (ref 78.0–100.0)
MONO ABS: 0.7 10*3/uL (ref 0.1–1.0)
MONOS PCT: 6 %
NEUTROS ABS: 9.4 10*3/uL — AB (ref 1.7–7.7)
Neutrophils Relative %: 83 %
Platelets: 234 10*3/uL (ref 150–400)
RBC: 5.16 MIL/uL (ref 4.22–5.81)
RDW: 13.7 % (ref 11.5–15.5)
WBC: 11.2 10*3/uL — ABNORMAL HIGH (ref 4.0–10.5)

## 2015-03-03 LAB — RAPID URINE DRUG SCREEN, HOSP PERFORMED
Amphetamines: NOT DETECTED
BENZODIAZEPINES: NOT DETECTED
Barbiturates: NOT DETECTED
COCAINE: POSITIVE — AB
OPIATES: NOT DETECTED
Tetrahydrocannabinol: POSITIVE — AB

## 2015-03-03 LAB — SEDIMENTATION RATE: Sed Rate: 2 mm/hr (ref 0–16)

## 2015-03-03 MED ORDER — CYCLOBENZAPRINE HCL 10 MG PO TABS: 10.0000 mg | ORAL_TABLET | Freq: Two times a day (BID) | ORAL | Status: DC | PRN

## 2015-03-03 MED ORDER — KETOROLAC TROMETHAMINE 15 MG/ML IJ SOLN
15.0000 mg | Freq: Once | INTRAMUSCULAR | Status: AC
  Administered 2015-03-03: 15 mg via INTRAVENOUS
  Filled 2015-03-03: qty 1

## 2015-03-03 MED ORDER — DIAZEPAM 5 MG PO TABS
5.0000 mg | ORAL_TABLET | Freq: Once | ORAL | Status: AC
  Administered 2015-03-03: 5 mg via ORAL
  Filled 2015-03-03: qty 1

## 2015-03-03 MED ORDER — OXYCODONE-ACETAMINOPHEN 5-325 MG PO TABS
ORAL_TABLET | ORAL | Status: AC
  Filled 2015-03-03: qty 1

## 2015-03-03 MED ORDER — GADOBENATE DIMEGLUMINE 529 MG/ML IV SOLN
16.0000 mL | Freq: Once | INTRAVENOUS | Status: AC | PRN
  Administered 2015-03-03: 16 mL via INTRAVENOUS

## 2015-03-03 MED ORDER — OXYCODONE-ACETAMINOPHEN 5-325 MG PO TABS
1.0000 | ORAL_TABLET | Freq: Once | ORAL | Status: AC
  Administered 2015-03-03: 1 via ORAL

## 2015-03-03 NOTE — ED Notes (Addendum)
Pt reports onset two days ago of severe right side head pain, neck pain and pain that radiates down his back and into right groin. Pain increases when turning his head. Pt is tearful at triage.

## 2015-03-03 NOTE — ED Notes (Signed)
Patient transported to MRI 

## 2015-03-03 NOTE — Discharge Instructions (Signed)

## 2015-03-03 NOTE — ED Provider Notes (Signed)
Arrival Date & Time: 03/03/15 & 1406  History   Chief Complaint  Patient presents with  . Groin Pain  . Neck Pain   HPI Michael Garza is a 29 y.o. male with a chief complaint of Groin Pain and Neck Pain  The patient presents for assessment of right-sided neck pain that radiates from posterior right side of occiput and transverses through upper trapezius into right shoulder joint and then travels around with radiation into right scapular area. 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 Patient states he is mildly weak in right upper extremity due to pain states however denies localized neck pain states mild headache that radiates into posterior neck however denies fevers chills or altered mental status. Patient states he is no prior medical history however has had tonsils removed. Patient takes no medications. Groin pain is endorsed as not current states it is intermittently present for the past several months cc note is no bulge no pain in testicles no penile discharge or penile lesions or penile pain. Patient has no new sexual partners. No dysuria or increased urinary frequency. Patient also has examined testicles and found no new masses or bulges.  Past Medical History  I reviewed & agree with nursing's documentation on PMHx, PSHx, SHx and FHx. History reviewed. No pertinent past medical history. Past Surgical History  Procedure Laterality Date  . Tonsillectomy    . Leg surgery     Social History   Social History  . Marital Status: Single    Spouse Name: N/A  . Number of Children: N/A  . Years of Education: N/A   Social History Main Topics  . Smoking status: Former Smoker -- 1.00 packs/day    Types: Cigarettes  . Smokeless tobacco: None  . Alcohol Use: Yes     Comment: occasional  . Drug Use: No  . Sexual Activity: Not Asked   Other Topics Concern  . None   Social History Narrative   Family History   Problem Relation Age of Onset  . Diabetes Other   . Hypertension Other     Review of Systems  Complete ROS obtained and pertinent positive and negatives documented above in HPI. All other ROS negative.  Allergies  Review of patient's allergies indicates no known allergies.  Home Medications   Prior to Admission medications   Medication Sig Start Date End Date Taking? Authorizing Provider  cephALEXin (KEFLEX) 500 MG capsule Take 1 capsule (500 mg total) by mouth 2 (two) times daily. Patient not taking: Reported on 03/03/2015 10/05/14   Oswaldo Conroy, PA-C  cyclobenzaprine (FLEXERIL) 10 MG tablet Take 1 tablet (10 mg total) by mouth 2 (two) times daily as needed for muscle spasms. DO NOT TAKE WITH OTHER SEDATING MEDICATIONS OR ALCOHOL OR NARCOTICS. THIS MEDICATION CAN MAKE YOU SLEEPY. DO NOT TAKE IF PERFORMING ACTIVITY THAT WOULD PLACE YOU AT RISK SHOULD YOU FALL ASLEEP OR BECOME DROWSY. 03/03/15   Jonette Eva, MD  ibuprofen (ADVIL,MOTRIN) 800 MG tablet Take 1 tablet (800 mg total) by mouth every 8 (eight) hours as needed. Patient not taking: Reported on 03/03/2015 12/06/14   Riki Sheer, PA-C  lidocaine (XYLOCAINE) 2 % jelly Apply 1 application topically 3 (three) times daily as needed. Patient not taking: Reported on 03/03/2015 10/15/14   Ozella Rocks, MD  traMADol (ULTRAM) 50 MG tablet Take 1 tablet (50 mg total) by mouth every 6 (six) hours as needed. Patient not  taking: Reported on 03/03/2015 12/06/14   Riki SheerMichelle G Young, PA-C    Physical Exam  BP 119/63 mmHg  Pulse 90  Temp(Src) 100.3 F (37.9 C) (Oral)  Resp 18  SpO2 99% Physical Exam  Constitutional: He is oriented to person, place, and time. He appears well-developed and well-nourished.  Non-toxic appearance. He does not appear ill. No distress.  HENT:  Head: Normocephalic and atraumatic.  Right Ear: External ear normal.  Left Ear: External ear normal.  No Right upper neck or facial fullness or swelling.  Eyes:  Pupils are equal, round, and reactive to light. No scleral icterus.  Neck: Normal range of motion. Neck supple. No tracheal deviation present.  No masses or midline ttp.  Cardiovascular: Normal heart sounds and intact distal pulses.   No murmur heard. Pulmonary/Chest: Effort normal and breath sounds normal. No stridor. No respiratory distress. He has no wheezes. He has no rales. He exhibits tenderness (right scapular and trapezius).  Abdominal: Soft. Bowel sounds are normal. He exhibits no distension. There is no tenderness. There is no rebound and no guarding.  Musculoskeletal:  Decreased ROM of RUE above horizontal.   Neurological: He is alert and oriented to person, place, and time. He has normal reflexes. No cranial nerve deficit or sensory deficit.  Strength of RUE decreased due to pain.  Skin: Skin is warm and dry. No pallor.  Psychiatric: He has a normal mood and affect. His behavior is normal.  Nursing note and vitals reviewed.  testicular exam reveals no masses or swelling no indirect or inguinal hernia. There is vertical lie of testicles are not tender to palpation no drainage or discharge or abnormal rash or vesicle to testicle and penis.  ED Course  Procedures Labs Review Labs Reviewed  URINE RAPID DRUG SCREEN, HOSP PERFORMED - Abnormal; Notable for the following:    Cocaine POSITIVE (*)    Tetrahydrocannabinol POSITIVE (*)    All other components within normal limits  CBC WITH DIFFERENTIAL/PLATELET - Abnormal; Notable for the following:    WBC 11.2 (*)    Neutro Abs 9.4 (*)    All other components within normal limits  BASIC METABOLIC PANEL - Abnormal; Notable for the following:    Chloride 100 (*)    All other components within normal limits  SEDIMENTATION RATE    Imaging Review Dg Chest 2 View  03/03/2015  CLINICAL DATA:  29 year old male with right chest pain for 1 day. EXAM: CHEST  2 VIEW COMPARISON:  12/16/2012 and prior chest radiographs dating back to  08/31/2006 FINDINGS: The cardiomediastinal silhouette is unremarkable. There is no evidence of focal airspace disease, pulmonary edema, suspicious pulmonary nodule/mass, pleural effusion, or pneumothorax. No acute bony abnormalities are identified. IMPRESSION: No active cardiopulmonary disease. Electronically Signed   By: Harmon PierJeffrey  Hu M.D.   On: 03/03/2015 17:17   Mr Cervical Spine W Wo Contrast  03/03/2015  CLINICAL DATA:  New onset of severe right-sided head and neck pain beginning 2 days ago which radiates into the back and right groin. Pain is worse when turning his head. EXAM: MRI CERVICAL SPINE WITHOUT AND WITH CONTRAST TECHNIQUE: Multiplanar and multiecho pulse sequences of the cervical spine, to include the craniocervical junction and cervicothoracic junction, were obtained according to standard protocol without and with intravenous contrast. CONTRAST:  16mL MULTIHANCE GADOBENATE DIMEGLUMINE 529 MG/ML IV SOLN COMPARISON:  Cervical spine radiographs 08/22/2010. FINDINGS: Normal signal is present in the cervical and upper thoracic spinal cord to the lowest imaged level, T2-3. Marrow  signal, vertebral body heights, and alignment are normal. Craniocervical junction is within normal limits. Visualized intracranial contents are normal. Flow is present in the vertebral arteries bilaterally. C2-3: Mild rightward disc bulging is present without significant stenosis. C3-4: A leftward disc osteophyte complex present. Uncovertebral spurring contributes to mild left foraminal narrowing. The central canal is patent. C4-5: Asymmetric right-sided uncovertebral spurring contributes to mild right foraminal narrowing. The left foramen is patent. C5-6: Mild uncovertebral spurring is present bilaterally. Facet hypertrophy contributes to mild left foraminal narrowing. C6-7: Mild uncovertebral spurring is present bilaterally without significant stenosis. C7-T1: Mild left foraminal narrowing is due to uncovertebral and facet  disease. IMPRESSION: 1. Mild left foraminal narrowing at C3-4, C5-6, and C7-T1. 2. Mild right foraminal narrowing at C4-5. Electronically Signed   By: Marin Roberts M.D.   On: 03/03/2015 19:14    Laboratory and Imaging results were personally reviewed by myself and used in the medical decision making of this patient's treatment and disposition.  EKG Interpretation  EKG Interpretation  Date/Time:    Ventricular Rate:    PR Interval:    QRS Duration:   QT Interval:    QTC Calculation:   R Axis:     Text Interpretation:        MDM  Michael Garza is a 29 y.o. male with H&P as above. ED clinical course as follows: Due to patient's [employer] endorsements screening EKG was obtained which showed no evidence of ischemia or other concerns. Do not suspect cardiac-related nature to patient's neck and chest endorsements. Patient has no concerning exam findings for hernias or swelling and testicular exam of groin is without abnormality. Patient has no bowel obstruction therefore hernia not likely. Given patient's work history of repetitive overhead movement concern have for muscular skeletal etiology of symptomatically endorsements at this time. Due to patient's fever concern have for infectious etiology such as spinal epidural abscess however MRI with and without reveals no concerning infectious lesions or evidence of cord compression. Patient does have foraminal stenosis that would lead to symptomatic endorsements patient requires no further workup at this time and can be discharged primary care follow-up. Patient will be discharged with muscle relaxant for acute episode of muscle spasm.   Clinical Impression:  1. Cervical radiculopathy    Disposition:  D/c to PCP follow up.  Patient care discussed with Dr. Clarene Duke, who oversaw their evaluation & treatment & voiced agreement. House Officer: Jonette Eva, MD, Emergency Medicine.  Jonette Eva, MD 03/03/15 1958  Laurence Spates,  MD 03/03/15 2006

## 2015-03-12 ENCOUNTER — Telehealth (HOSPITAL_COMMUNITY): Payer: Self-pay

## 2015-03-12 ENCOUNTER — Ambulatory Visit: Payer: BLUE CROSS/BLUE SHIELD

## 2015-03-24 ENCOUNTER — Inpatient Hospital Stay: Payer: BLUE CROSS/BLUE SHIELD | Admitting: Family Medicine

## 2015-05-11 ENCOUNTER — Emergency Department (HOSPITAL_COMMUNITY)
Admission: EM | Admit: 2015-05-11 | Discharge: 2015-05-11 | Disposition: A | Discharge status: Home / Self Care | Payer: BLUE CROSS/BLUE SHIELD | Attending: Physician Assistant | Admitting: Physician Assistant

## 2015-05-11 ENCOUNTER — Encounter (HOSPITAL_COMMUNITY): Payer: Self-pay | Admitting: Emergency Medicine

## 2015-05-11 DIAGNOSIS — Z87891 Personal history of nicotine dependence: Secondary | ICD-10-CM | POA: Insufficient documentation

## 2015-05-11 DIAGNOSIS — R5383 Other fatigue: Secondary | ICD-10-CM | POA: Diagnosis present

## 2015-05-11 DIAGNOSIS — E860 Dehydration: Secondary | ICD-10-CM | POA: Diagnosis not present

## 2015-05-11 LAB — CBG MONITORING, ED: GLUCOSE-CAPILLARY: 89 mg/dL (ref 65–99)

## 2015-05-11 NOTE — ED Notes (Signed)
Patient complains of weakness after drinking alcohol last night, states he thinks he blood sugar is low.  CBG 89 during triage.  Patient more alert after being told his blood sugar is normal.  Patient in no apparent distress at this time.

## 2015-05-11 NOTE — Discharge Instructions (Signed)
Drink plenty of fluids over the next 24 hours Follow up with primary physician as needed

## 2015-05-11 NOTE — ED Provider Notes (Signed)
CSN: [id number]     Arrival date & time 05/11/15  4098 History   First MD Initiated Contact with Patient 05/11/15 1001     Chief Complaint  Patient presents with  . Fatigue     (Consider location/radiation/quality/duration/timing/severity/associated sxs/prior Treatment) The history is provided by the patient and medical records. No language interpreter was used.   Michael Garza is a 30 y.o. male  who presents to the Emergency Department after a night of drinking stating he feels tired. He thought it was low blood sugar, but states that he was told his blood sugar here was normal. At time of exam, patient had no complaints. Admits to NBNB emesis earlier today. No n/v at present. No fevers, no headaches. No other complaints at this time.   History reviewed. No pertinent past medical history. Past Surgical History  Procedure Laterality Date  . Tonsillectomy    . Leg surgery     Family History  Problem Relation Age of Onset  . Diabetes Other   . Hypertension Other    Social History  Substance Use Topics  . Smoking status: Former Smoker -- 1.00 packs/day    Types: Cigarettes  . Smokeless tobacco: None  . Alcohol Use: Yes     Comment: occasional    Review of Systems  Constitutional: Positive for fatigue. Negative for fever and chills.  HENT: Negative for congestion, rhinorrhea and sore throat.   Eyes: Negative for visual disturbance.  Respiratory: Negative for cough, shortness of breath and wheezing.   Cardiovascular: Negative.   Gastrointestinal: Negative for nausea, vomiting, abdominal pain, diarrhea and constipation.  Musculoskeletal: Negative for myalgias, back pain, arthralgias and neck pain.  Skin: Negative for rash.  Allergic/Immunologic: Negative for immunocompromised state.  Neurological: Negative for dizziness, syncope and headaches.      Allergies  Review of patient's allergies indicates no known allergies.  Home Medications   Prior to Admission medications    Medication Sig Start Date End Date Taking? Authorizing Provider  cephALEXin (KEFLEX) 500 MG capsule Take 1 capsule (500 mg total) by mouth 2 (two) times daily. Patient not taking: Reported on 03/03/2015 10/05/14   Oswaldo Conroy, PA-C  cyclobenzaprine (FLEXERIL) 10 MG tablet Take 1 tablet (10 mg total) by mouth 2 (two) times daily as needed for muscle spasms. DO NOT TAKE WITH OTHER SEDATING MEDICATIONS OR ALCOHOL OR NARCOTICS. THIS MEDICATION CAN MAKE YOU SLEEPY. DO NOT TAKE IF PERFORMING ACTIVITY THAT WOULD PLACE YOU AT RISK SHOULD YOU FALL ASLEEP OR BECOME DROWSY. 03/03/15   Jonette Eva, MD  ibuprofen (ADVIL,MOTRIN) 800 MG tablet Take 1 tablet (800 mg total) by mouth every 8 (eight) hours as needed. Patient not taking: Reported on 03/03/2015 12/06/14   Riki Sheer, PA-C  lidocaine (XYLOCAINE) 2 % jelly Apply 1 application topically 3 (three) times daily as needed. Patient not taking: Reported on 03/03/2015 10/15/14   Ozella Rocks, MD  traMADol (ULTRAM) 50 MG tablet Take 1 tablet (50 mg total) by mouth every 6 (six) hours as needed. Patient not taking: Reported on 03/03/2015 12/06/14   Riki Sheer, PA-C   BP 157/81 mmHg  Pulse 74  Temp(Src) 97.5 F (36.4 C) (Oral)  SpO2 99% Physical Exam  Constitutional: He is oriented to person, place, and time. He appears well-developed and well-nourished.  Alert and in no acute distress  HENT:  Head: Normocephalic and atraumatic.  Mouth/Throat: Oropharynx is clear and moist.  Mucus membranes tacky  Cardiovascular: Normal rate, regular rhythm and normal heart  sounds.  Exam reveals no gallop and no friction rub.   No murmur heard. Pulmonary/Chest: Effort normal and breath sounds normal. No respiratory distress. He has no wheezes. He has no rales.  Abdominal: He exhibits no mass. There is no rebound and no guarding.  Abdomen soft, non-tender, non-distended Bowel sounds positive in all four quadrants  Musculoskeletal: He exhibits no edema.   Neurological: He is alert and oriented to person, place, and time.  Skin: Skin is warm and dry. No rash noted.  Delayed cap refill  Psychiatric: He has a normal mood and affect. His behavior is normal. Judgment and thought content normal.  Nursing note and vitals reviewed.   ED Course  Procedures (including critical care time) Labs Review Labs Reviewed  CBG MONITORING, ED    Imaging Review No results found. I have personally reviewed and evaluated these images and lab results as part of my medical decision-making.   EKG Interpretation None      MDM   Final diagnoses:  Dehydration   Michael Garza presents for concern of low blood sugar. CBG normal - patient with no complaints at time of exam. Exam showed signs of mild dehydration likely 2/2 vomiting and increased alcohol consumption. Discussed oral rehydration therapy at home with patient who expresses understanding and agreement with plan. Patient to be discharged to home in good and stable condition. PCP follow up as needed.   Chase PicketJaime Pilcher Ward, PA-C 05/11/15 1043  Courteney Randall AnLyn Mackuen, MD 05/11/15 1656

## 2016-04-15 ENCOUNTER — Encounter (HOSPITAL_COMMUNITY): Payer: Self-pay

## 2016-04-15 ENCOUNTER — Emergency Department (HOSPITAL_COMMUNITY)
Admission: EM | Admit: 2016-04-15 | Discharge: 2016-04-15 | Disposition: A | Discharge status: Home / Self Care | Payer: BLUE CROSS/BLUE SHIELD | Attending: Emergency Medicine | Admitting: Emergency Medicine

## 2016-04-15 DIAGNOSIS — M25511 Pain in right shoulder: Secondary | ICD-10-CM | POA: Diagnosis present

## 2016-04-15 DIAGNOSIS — M542 Cervicalgia: Secondary | ICD-10-CM | POA: Insufficient documentation

## 2016-04-15 DIAGNOSIS — Z87891 Personal history of nicotine dependence: Secondary | ICD-10-CM | POA: Insufficient documentation

## 2016-04-15 MED ORDER — IBUPROFEN 800 MG PO TABS: 800.0000 mg | ORAL_TABLET | Freq: Three times a day (TID) | ORAL | 0 refills | Status: DC | PRN

## 2016-04-15 MED ORDER — CYCLOBENZAPRINE HCL 10 MG PO TABS: 10.0000 mg | ORAL_TABLET | Freq: Every day | ORAL | 0 refills | Status: DC

## 2016-04-15 NOTE — ED Notes (Signed)
Declined W/C at D/C and was escorted to lobby by RN. 

## 2016-04-15 NOTE — ED Provider Notes (Signed)
[facility]-EMERGENCY DEPT Provider Note   CSN: [id number] Arrival date & time: 04/15/16  1440  By signing my name below, I, [name], attest that this documentation has been prepared under the direction and in the presence of [name], PA-C. Electronically Signed: [name], ED Scribe. 04/15/16. 3:36 PM.   History   Chief Complaint Chief Complaint  Patient presents with  . Back Pain  . Shoulder Pain    HPI Comments: Michael Garza is a 30 y.o. male who presents to the Emergency Department complaining of gradually worsening moderate bilateral posterior shoulder pain he describes as cramping onset several months ago, worsening within the last week. He reports associated lower back pain and intermittent bilateral upper leg cramping. He denies any known falls, injuries, or trauma but states that he has been works at [employer] with repetitive lifting and has been working more these past 2 weeks. No alleviating factors noted. Pt has not tried any medications PTA. He has NKDA. He denies any fever, chills, nausea, vomiting, numbness/tingling, weakness, bowel/bladder incontinence, urinary symptoms, or any other symptoms.   Witnessed pt ambulate to room without difficulty, even stopping to pick up a paper off the floor without any issues.    The history is provided by the patient. No language interpreter was used.    History reviewed. No pertinent past medical history.  There are no active problems to display for this patient.   Past Surgical History:  Procedure Laterality Date  . LEG SURGERY    . TONSILLECTOMY         Home Medications    Prior to Admission medications   Medication Sig Start Date End Date Taking? Authorizing Provider  cephALEXin (KEFLEX) 500 MG capsule Take 1 capsule (500 mg total) by mouth 2 (two) times daily. Patient not taking: Reported on 03/03/2015 10/05/14   Oswaldo ConroyVictoria Creech, PA-C  cyclobenzaprine (FLEXERIL) 10 MG tablet Take 1 tablet (10 mg total) by  mouth 2 (two) times daily as needed for muscle spasms. DO NOT TAKE WITH OTHER SEDATING MEDICATIONS OR ALCOHOL OR NARCOTICS. THIS MEDICATION CAN MAKE YOU SLEEPY. DO NOT TAKE IF PERFORMING ACTIVITY THAT WOULD PLACE YOU AT RISK SHOULD YOU FALL ASLEEP OR BECOME DROWSY. 03/03/15   Jonette EvaBrad Chapman, MD  ibuprofen (ADVIL,MOTRIN) 800 MG tablet Take 1 tablet (800 mg total) by mouth every 8 (eight) hours as needed. Patient not taking: Reported on 03/03/2015 12/06/14   Riki SheerMichelle G Young, PA-C  lidocaine (XYLOCAINE) 2 % jelly Apply 1 application topically 3 (three) times daily as needed. Patient not taking: Reported on 03/03/2015 10/15/14   Ozella Rocksavid J Merrell, MD  traMADol (ULTRAM) 50 MG tablet Take 1 tablet (50 mg total) by mouth every 6 (six) hours as needed. Patient not taking: Reported on 03/03/2015 12/06/14   Riki SheerMichelle G Young, PA-C    Family History Family History  Problem Relation Age of Onset  . Diabetes Other   . Hypertension Other     Social History Social History  Substance Use Topics  . Smoking status: Former Smoker    Packs/day: 1.00    Types: Cigarettes  . Smokeless tobacco: Not on file  . Alcohol use Yes     Comment: occasional     Allergies   Patient has no known allergies.   Review of Systems Review of Systems  All other systems reviewed and are negative.    Physical Exam Updated Vital Signs BP 119/77 (BP Location: Right Arm)   Pulse 86   Temp 98.1 F (36.7 C) (  Oral)   Resp 19   SpO2 97%   Physical Exam  Constitutional: He is oriented to person, place, and time. He appears well-developed and well-nourished. No distress.  HENT:  Head: Normocephalic and atraumatic.  Eyes: Pupils are equal, round, and reactive to light.  Cardiovascular: Normal rate, regular rhythm and normal heart sounds.   Pulmonary/Chest: Effort normal and breath sounds normal.  Musculoskeletal:       Right shoulder: He exhibits normal range of motion, no tenderness and no bony tenderness.       Left  shoulder: He exhibits normal range of motion, no tenderness, no bony tenderness and no swelling.       Cervical back: He exhibits normal range of motion, no tenderness and no bony tenderness.  Neurological: He is alert and oriented to person, place, and time. He displays normal reflexes. No sensory deficit. He exhibits normal muscle tone. Coordination normal.  Skin: Skin is warm and dry.  Psychiatric: He has a normal mood and affect.  Nursing note and vitals reviewed.    ED Treatments / Results  DIAGNOSTIC STUDIES: Oxygen Saturation is 97% on RA, adequate by my interpretation.    COORDINATION OF CARE: 3:44 PM- Pt advised of plan for treatment and pt agrees. Advised pt to alternate ice and heat for cramping.    Labs (all labs ordered are listed, but only abnormal results are displayed) Labs Reviewed - No data to display  EKG  EKG Interpretation None       Radiology No results found.  Procedures Procedures (including critical care time)  Medications Ordered in ED Medications - No data to display   Initial Impression / Assessment and Plan / ED Course  [name], PA-C has reviewed the triage vital signs and the nursing notes.  Pertinent labs & imaging results that were available during my care of the patient were reviewed by me and considered in my medical decision making (see chart for details).  Clinical Course     Patient with back pain.  No neurological deficits and normal neuro exam.  Patient can walk but states is painful.  No loss of bowel or bladder control.  No concern for cauda equina.  No fever, night sweats, weight loss, h/o cancer, IVDU.  RICE protocol and pain medicine indicated and discussed with patient.    Final Clinical Impressions(s) / ED Diagnoses   Final diagnoses:  None    New Prescriptions New Prescriptions   No medications on file   I personally performed the services described in this documentation, which was scribed in my presence. The  recorded information has been reviewed and is accurate.   Charlestine NightChristopher Syvanna Ciolino, PA-C 04/20/16 [id number]    Rolland PorterMark James, MD 04/27/16 [phone]

## 2016-04-15 NOTE — Discharge Instructions (Signed)
Return here as needed. Follow up with a primary doctor. Use ice and heat on the areas that are sore

## 2016-04-15 NOTE — ED Triage Notes (Signed)
Patient here with bilateral shoulder, back and leg soreness. States that he thinks muscle cramps related to all the lifting he is doing at [employer]

## 2016-06-07 ENCOUNTER — Emergency Department (HOSPITAL_COMMUNITY)
Admission: EM | Admit: 2016-06-07 | Discharge: 2016-06-07 | Disposition: A | Discharge status: Home / Self Care | Payer: BLUE CROSS/BLUE SHIELD | Attending: Emergency Medicine | Admitting: Emergency Medicine

## 2016-06-07 DIAGNOSIS — Y939 Activity, unspecified: Secondary | ICD-10-CM | POA: Diagnosis not present

## 2016-06-07 DIAGNOSIS — R0781 Pleurodynia: Secondary | ICD-10-CM | POA: Diagnosis not present

## 2016-06-07 DIAGNOSIS — Y999 Unspecified external cause status: Secondary | ICD-10-CM | POA: Insufficient documentation

## 2016-06-07 DIAGNOSIS — Y92410 Unspecified street and highway as the place of occurrence of the external cause: Secondary | ICD-10-CM | POA: Diagnosis not present

## 2016-06-07 DIAGNOSIS — J069 Acute upper respiratory infection, unspecified: Secondary | ICD-10-CM | POA: Diagnosis not present

## 2016-06-07 DIAGNOSIS — R05 Cough: Secondary | ICD-10-CM | POA: Diagnosis present

## 2016-06-07 DIAGNOSIS — Z87891 Personal history of nicotine dependence: Secondary | ICD-10-CM | POA: Insufficient documentation

## 2016-06-07 MED ORDER — BENZONATATE 100 MG PO CAPS: 100.0000 mg | ORAL_CAPSULE | Freq: Three times a day (TID) | ORAL | 0 refills | Status: DC

## 2016-06-07 MED ORDER — LIDOCAINE 5 % EX PTCH: 1.0000 | MEDICATED_PATCH | CUTANEOUS | 0 refills | Status: DC

## 2016-06-07 MED ORDER — IBUPROFEN 800 MG PO TABS: 800.0000 mg | ORAL_TABLET | Freq: Three times a day (TID) | ORAL | 0 refills | Status: DC

## 2016-06-07 NOTE — Discharge Instructions (Signed)
Your symptoms are consistent with a viral illness. Viruses do not require antibiotics. Treatment is symptomatic care and it is important to note that these symptoms may last for 7-14 days. Symptoms will be intensified and complicated by dehydration. Dehydration can also extend the duration of symptoms. Drink plenty of fluids and get plenty of rest. You should be drinking at least half a liter of water an hour to stay hydrated. Electrolyte drinks are also encouraged. You should be drinking enough fluids to make your urine light yellow, almost clear. If this is not the case, you are not drinking enough water. Ibuprofen, Naproxen, or Tylenol for pain or fever. Tessalon for cough. Plain Mucinex may help relieve congestion. Saline sinus rinses and saline nasal sprays may also help relieve congestion. Warm liquids or Chloraseptic spray may help soothe a sore throat. Follow up with a primary care provider, as needed, for any future management of this issue.  For your rib pain: Antiinflammatory medications: Take 500 mg of naproxen every 12 hours or 800 mg of ibuprofen every 8 hours for the next 3 days. Then as needed after the 3 days. Take these medications with food to avoid upset stomach. Choose only one of these medications, do not take them together.   Lidocaine patches: These are available via either prescription or over-the-counter. The over-the-counter option may be more economical one and are likely just as effective. There are multiple over-the-counter brands, such as Salonpas.

## 2016-06-07 NOTE — ED Notes (Signed)
Patient d/c'd from monitor, continuous pulse oximetry and blood pressure cuff; patient is being discharged home; patient awaiting discharge paperwork

## 2016-06-07 NOTE — ED Notes (Signed)
Patient undressed, in gown, on continuous pulse oximetry and blood pressure cuff 
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
## 2017-05-25 DIAGNOSIS — M545 Low back pain, unspecified: Secondary | ICD-10-CM | POA: Insufficient documentation

## 2017-05-25 DIAGNOSIS — G8929 Other chronic pain: Secondary | ICD-10-CM | POA: Insufficient documentation

## 2018-07-01 DIAGNOSIS — M4310 Spondylolisthesis, site unspecified: Secondary | ICD-10-CM | POA: Insufficient documentation

## 2018-07-01 DIAGNOSIS — M5416 Radiculopathy, lumbar region: Secondary | ICD-10-CM | POA: Insufficient documentation

## 2019-03-30 ENCOUNTER — Encounter (HOSPITAL_COMMUNITY): Payer: Self-pay

## 2019-03-30 ENCOUNTER — Ambulatory Visit (HOSPITAL_COMMUNITY)
Admission: EM | Admit: 2019-03-30 | Discharge: 2019-03-30 | Disposition: A | Discharge status: Home / Self Care | Payer: BC Managed Care – PPO | Attending: Family Medicine | Admitting: Family Medicine

## 2019-03-30 ENCOUNTER — Other Ambulatory Visit: Payer: Self-pay

## 2019-03-30 DIAGNOSIS — Z20822 Contact with and (suspected) exposure to covid-19: Secondary | ICD-10-CM

## 2019-03-30 DIAGNOSIS — Z20828 Contact with and (suspected) exposure to other viral communicable diseases: Secondary | ICD-10-CM

## 2019-03-30 DIAGNOSIS — R0981 Nasal congestion: Secondary | ICD-10-CM

## 2019-03-30 NOTE — Discharge Instructions (Addendum)
Take Tylenol for pain or fever May use over-the-counter cold and cough medicines Your test results will be ready within a day or 2, and results will be sent to your phone by my chart Quarantine at home until you get your test results back

## 2019-03-30 NOTE — ED Triage Notes (Signed)
Pt present runny nose and coughing. Symptoms start 3 days ago with a runny nose. Pt has been tested for covid 19 last week and is waiting for results.

## 2019-03-31 NOTE — ED Provider Notes (Signed)
[place]    CSN: [id number] Arrival date & time: 03/30/19  1750      History   Chief Complaint Chief Complaint  Patient presents with  . Cough  . Nasal Congestion    HPI Michael Garza is a 33 y.o. male.   HPI   Patient was tested for coronavirus on 02/24/2019.  Results not yet available.  He did develop symptoms on 02/25/2019.  He has a runny nose and slight cough.  No fever or chills.  No headache.  No body ache.  No Covid exposure.  He states he works at [employer].  If he goes to [employer] with cold symptoms they will not allow him to work.  He is here today requesting Covid repeat testing.  He does not believe that he has Covid. History reviewed. No pertinent past medical history.  There are no active problems to display for this patient.   Past Surgical History:  Procedure Laterality Date  . LEG SURGERY    . TONSILLECTOMY         Home Medications    Prior to Admission medications   Medication Sig Start Date End Date Taking? Authorizing Provider  ibuprofen (ADVIL,MOTRIN) 800 MG tablet Take 1 tablet (800 mg total) by mouth 3 (three) times daily. 06/07/16   Joy, Helane Gunther, PA-C    Family History Family History  Problem Relation Age of Onset  . Diabetes Other   . Hypertension Other     Social History Social History   Tobacco Use  . Smoking status: Former Smoker    Packs/day: 1.00    Types: Cigarettes  Substance Use Topics  . Alcohol use: Yes    Comment: occasional  . Drug use: No     Allergies   Patient has no known allergies.   Review of Systems Review of Systems  Constitutional: Negative for chills and fever.  HENT: Positive for rhinorrhea. Negative for ear pain and sore throat.   Eyes: Negative for pain and visual disturbance.  Respiratory: Positive for cough. Negative for shortness of breath.   Cardiovascular: Negative for chest pain and palpitations.  Gastrointestinal: Negative for abdominal pain and vomiting.  Genitourinary: Negative  for dysuria and hematuria.  Musculoskeletal: Negative for arthralgias and back pain.  Skin: Negative for color change and rash.  Neurological: Negative for seizures and syncope.  All other systems reviewed and are negative.    Physical Exam Triage Vital Signs ED Triage Vitals  Enc Vitals Group     BP 03/30/19 1820 (!) 137/96     Pulse Rate 03/30/19 1820 74     Resp 03/30/19 1820 18     Temp 03/30/19 1820 97.9 F (36.6 C)     Temp Source 03/30/19 1820 Oral     SpO2 03/30/19 1820 99 %     Weight --      Height --      Head Circumference --      Peak Flow --      Pain Score 03/30/19 1824 1     Pain Loc --      Pain Edu? --      Excl. in [place]? --    No data found.  Updated Vital Signs BP (!) 137/96 (BP Location: Left Arm)   Pulse 74   Temp 97.9 F (36.6 C) (Oral)   Resp 18   SpO2 99%      Physical Exam Constitutional:      General: He is not in acute  distress.    Appearance: He is well-developed.  HENT:     Head: Normocephalic and atraumatic.     Nose: No congestion or rhinorrhea.     Mouth/Throat:     Mouth: Mucous membranes are moist.     Pharynx: No oropharyngeal exudate or posterior oropharyngeal erythema.  Eyes:     Conjunctiva/sclera: Conjunctivae normal.     Pupils: Pupils are equal, round, and reactive to light.  Neck:     Musculoskeletal: Normal range of motion.  Cardiovascular:     Rate and Rhythm: Normal rate and regular rhythm.  Pulmonary:     Effort: Pulmonary effort is normal. No respiratory distress.     Breath sounds: Normal breath sounds.  Abdominal:     General: There is no distension.     Palpations: Abdomen is soft.  Musculoskeletal: Normal range of motion.  Skin:    General: Skin is warm and dry.  Neurological:     Mental Status: He is alert.    No physical exam findings.  UC Treatments / Results  Labs (all labs ordered are listed, but only abnormal results are displayed) Labs Reviewed - No data to display  EKG   Radiology  No results found.  Procedures Procedures (including critical care time)  Medications Ordered in UC Medications - No data to display  Initial Impression / Assessment and Plan / UC Course  I have reviewed the triage vital signs and the nursing notes.  Pertinent labs & imaging results that were available during my care of the patient were reviewed by me and considered in my medical decision making (see chart for details).     I did not do rapid Covid testing on this patient with minimal symptoms as he did not feel like he had Covid, and he was Covid test pending.  I did send a Covid test to the laboratory for him.  Coronavirus unlikely based on symptoms and physical exam findings Final Clinical Impressions(s) / UC Diagnoses   Final diagnoses:  Nasal congestion  Encounter for laboratory testing for COVID-19 virus     Discharge Instructions     Take Tylenol for pain or fever May use over-the-counter cold and cough medicines Your test results will be ready within a day or 2, and results will be sent to your phone by my chart Quarantine at home until you get your test results back   ED Prescriptions    None     PDMP not reviewed this encounter.   Eustace Moore, MD 03/31/19 [phone]

## 2019-05-07 ENCOUNTER — Ambulatory Visit
Admission: EM | Admit: 2019-05-07 | Discharge: 2019-05-07 | Disposition: A | Discharge status: Home / Self Care | Payer: BC Managed Care – PPO | Attending: Physician Assistant | Admitting: Physician Assistant

## 2019-05-07 ENCOUNTER — Other Ambulatory Visit: Payer: Self-pay

## 2019-05-07 DIAGNOSIS — H6690 Otitis media, unspecified, unspecified ear: Secondary | ICD-10-CM | POA: Diagnosis not present

## 2019-05-07 DIAGNOSIS — H60311 Diffuse otitis externa, right ear: Secondary | ICD-10-CM | POA: Diagnosis not present

## 2019-05-07 MED ORDER — AMOXICILLIN-POT CLAVULANATE 875-125 MG PO TABS: 1.0000 | ORAL_TABLET | Freq: Two times a day (BID) | ORAL | 0 refills | Status: DC

## 2019-05-07 MED ORDER — OFLOXACIN 0.3 % OP SOLN: OPHTHALMIC | 0 refills | Status: DC

## 2019-05-07 NOTE — ED Provider Notes (Signed)
[facility]    CSN: [id number] Arrival date & time: 05/07/19  1019      History   Chief Complaint Chief Complaint  Patient presents with  . Otalgia    HPI Michael Garza is a 33 y.o. male.   33 year old male comes in for 3-day history of right ear drainage with an odor.  He has intermittent right ear pain.  States when increased drainage, can have some muffled hearing.  Denies rhinorrhea, nasal congestion, cough, sore throat.  Denies fever, chills, body aches.  Denies injury/trauma.  Though he has used consults intermittently.  He has history of chronic otitis media, was referred to ENT few months ago, but states never got a call for appointment.  Would like ENT information.     History reviewed. No pertinent past medical history.  There are no problems to display for this patient.   Past Surgical History:  Procedure Laterality Date  . LEG SURGERY    . TONSILLECTOMY         Home Medications    Prior to Admission medications   Medication Sig Start Date End Date Taking? Authorizing Provider  amoxicillin-clavulanate (AUGMENTIN) 875-125 MG tablet Take 1 tablet by mouth every 12 (twelve) hours. 05/07/19   Tasia Catchings, Johari Bennetts V, PA-C  ofloxacin (OCUFLOX) 0.3 % ophthalmic solution 10 drops daily for 5 days. 05/07/19   Ok Edwards, PA-C    Family History Family History  Problem Relation Age of Onset  . Diabetes Other   . Hypertension Other     Social History Social History   Tobacco Use  . Smoking status: Former Smoker    Packs/day: 1.00    Types: Cigarettes  . Smokeless tobacco: Never Used  Substance Use Topics  . Alcohol use: Yes    Comment: occasional  . Drug use: No     Allergies   Patient has no known allergies.   Review of Systems Review of Systems  Reason unable to perform ROS: See HPI as above.     Physical Exam Triage Vital Signs ED Triage Vitals [05/07/19 1029]  Enc Vitals Group     BP 128/71     Pulse Rate 83     Resp 16     Temp  98 F (36.7 C)     Temp Source Oral     SpO2 97 %     Weight      Height      Head Circumference      Peak Flow      Pain Score 2     Pain Loc      Pain Edu?      Excl. in [place]?    No data found.  Updated Vital Signs BP 128/71 (BP Location: Left Arm)   Pulse 83   Temp 98 F (36.7 C) (Oral)   Resp 16   SpO2 97%   Physical Exam Constitutional:      General: He is not in acute distress.    Appearance: He is well-developed. He is not diaphoretic.  HENT:     Head: Normocephalic and atraumatic.     Ears:     Comments: Right ear: No tenderness to palpation of tragus, mastoid.  Diffuse erythema to the ear canal with mild swelling.  Ear canal moist, without overt drainage. No fungal spores seen.  TM perforation to the 4-5 oclock region, this is at baseline per patient.  Otherwise TM opaque, erythematous.  Left ear: No tenderness to palpation of  tragus, mastoid.  No erythema to the ear canal.  TM intact, scarring noted.     Nose: No congestion or rhinorrhea.     Right Sinus: No maxillary sinus tenderness or frontal sinus tenderness.     Left Sinus: No maxillary sinus tenderness or frontal sinus tenderness.     Mouth/Throat:     Mouth: Mucous membranes are moist.     Pharynx: Oropharynx is clear. Uvula midline. No oropharyngeal exudate, posterior oropharyngeal erythema or uvula swelling.  Eyes:     Conjunctiva/sclera: Conjunctivae normal.     Pupils: Pupils are equal, round, and reactive to light.  Pulmonary:     Effort: Pulmonary effort is normal. No respiratory distress.  Skin:    General: Skin is warm and dry.  Neurological:     Mental Status: He is alert and oriented to person, place, and time.    UC Treatments / Results  Labs (all labs ordered are listed, but only abnormal results are displayed) Labs Reviewed - No data to display  EKG   Radiology No results found.  Procedures Procedures (including critical care time)  Medications Ordered in UC Medications - No  data to display  Initial Impression / Assessment and Plan / UC Course  I have reviewed the triage vital signs and the nursing notes.  Pertinent labs & imaging results that were available during my care of the patient were reviewed by me and considered in my medical decision making (see chart for details).    Will cover for otitis externa with ofloxacin. Given chronic otitis media, currently with possible exacerbation, will also cover with augmentin. Otherwise, patient to follow up with ENT for further evaluation and management needed. Return precautions given.  Final Clinical Impressions(s) / UC Diagnoses   Final diagnoses:  Acute diffuse otitis externa of right ear  Chronic otitis media, unspecified otitis media type   ED Prescriptions    Medication Sig Dispense Auth. Provider   amoxicillin-clavulanate (AUGMENTIN) 875-125 MG tablet Take 1 tablet by mouth every 12 (twelve) hours. 14 tablet Son Barkan V, PA-C   ofloxacin (OCUFLOX) 0.3 % ophthalmic solution 10 drops daily for 5 days. 5 mL Belinda Fisher, PA-C     PDMP not reviewed this encounter.   Belinda Fisher, PA-C 05/07/19 1053

## 2019-05-07 NOTE — Discharge Instructions (Addendum)
Start augmentin as directed. Ofloxacin eye drops as ear drops as directed. Follow up with ENT as directed.

## 2019-05-07 NOTE — ED Triage Notes (Signed)
Pt c/o rt ear drainage with an odor x3 days. States hx of ear infections. Pt denies any other URI sx's

## 2020-02-15 ENCOUNTER — Other Ambulatory Visit: Payer: Self-pay

## 2020-02-15 ENCOUNTER — Ambulatory Visit
Admission: EM | Admit: 2020-02-15 | Discharge: 2020-02-15 | Disposition: A | Discharge status: Home / Self Care | Payer: BC Managed Care – PPO | Attending: Emergency Medicine | Admitting: Emergency Medicine

## 2020-02-15 DIAGNOSIS — Z20822 Contact with and (suspected) exposure to covid-19: Secondary | ICD-10-CM

## 2020-02-15 NOTE — ED Triage Notes (Signed)
Pt here for covid test; denies sx

## 2020-02-16 LAB — NOVEL CORONAVIRUS, NAA: SARS-CoV-2, NAA: NOT DETECTED

## 2020-02-16 LAB — SARS-COV-2, NAA 2 DAY TAT

## 2020-05-10 ENCOUNTER — Other Ambulatory Visit: Payer: BC Managed Care – PPO

## 2020-05-10 DIAGNOSIS — Z20822 Contact with and (suspected) exposure to covid-19: Secondary | ICD-10-CM

## 2020-05-12 LAB — SARS-COV-2, NAA 2 DAY TAT

## 2020-05-12 LAB — NOVEL CORONAVIRUS, NAA: SARS-CoV-2, NAA: DETECTED — AB

## 2020-05-18 ENCOUNTER — Other Ambulatory Visit: Payer: Self-pay

## 2020-05-18 ENCOUNTER — Other Ambulatory Visit: Payer: Self-pay | Admitting: Family Medicine

## 2020-05-18 ENCOUNTER — Encounter: Payer: Self-pay | Admitting: Emergency Medicine

## 2020-05-18 ENCOUNTER — Ambulatory Visit
Admission: EM | Admit: 2020-05-18 | Discharge: 2020-05-18 | Disposition: A | Discharge status: Home / Self Care | Payer: BC Managed Care – PPO | Attending: Internal Medicine | Admitting: Internal Medicine

## 2020-05-18 DIAGNOSIS — H60391 Other infective otitis externa, right ear: Secondary | ICD-10-CM

## 2020-05-18 MED ORDER — CIPROFLOXACIN-HYDROCORTISONE 0.2-1 % OT SUSP: 3.0000 [drp] | Freq: Two times a day (BID) | OTIC | 0 refills | Status: AC

## 2020-05-18 MED ORDER — ACETIC ACID 2 % OT SOLN: 4.0000 [drp] | OTIC | 0 refills | Status: AC

## 2020-05-18 NOTE — ED Provider Notes (Signed)
[facility]    CSN: [id number] Arrival date & time: 05/18/20  0834      History   Chief Complaint Chief Complaint  Patient presents with  . Otalgia    HPI Michael Garza is a 35 y.o. male.   Patient presents today with 1 month of right ear pain and decreased hearing.  Was seen about 1 month ago and received oral antibiotics.  Symptoms improved while taking antibiotics but returned after he stopped.  He has had a repeated history of ear infections since childhood.  Has not been swimming or get water in his ears other than when taking a shower.  No dizziness.  No tinnitus.     History reviewed. No pertinent past medical history.  There are no problems to display for this patient.   Past Surgical History:  Procedure Laterality Date  . LEG SURGERY    . TONSILLECTOMY         Home Medications    Prior to Admission medications   Medication Sig Start Date End Date Taking? Authorizing Provider  ciprofloxacin-hydrocortisone (CIPRO HC OTIC) OTIC suspension Place 3 drops into the right ear 2 (two) times daily for 7 days. 05/18/20 05/25/20 Yes Sandre Kitty, MD  amoxicillin-clavulanate (AUGMENTIN) 875-125 MG tablet Take 1 tablet by mouth every 12 (twelve) hours. 05/07/19   Cathie Hoops, Amy V, PA-C  ofloxacin (OCUFLOX) 0.3 % ophthalmic solution 10 drops daily for 5 days. 05/07/19   Belinda Fisher, PA-C    Family History Family History  Problem Relation Age of Onset  . Diabetes Other   . Hypertension Other     Social History Social History   Tobacco Use  . Smoking status: Former Smoker    Packs/day: 1.00    Types: Cigarettes  . Smokeless tobacco: Never Used  Substance Use Topics  . Alcohol use: Yes    Comment: occasional  . Drug use: No     Allergies   Patient has no known allergies.   Review of Systems Review of Systems  All other systems reviewed and are negative.    Physical Exam Triage Vital Signs ED Triage Vitals [05/18/20 0849]  Enc Vitals Group      BP 123/86     Pulse Rate 85     Resp 16     Temp      Temp Source Oral     SpO2 98 %     Weight      Height      Head Circumference      Peak Flow      Pain Score 10     Pain Loc      Pain Edu?      Excl. in [place]?    No data found.  Updated Vital Signs BP 123/86 (BP Location: Right Arm)   Pulse 85   Resp 16   SpO2 98%   Visual Acuity Right Eye Distance:   Left Eye Distance:   Bilateral Distance:    Right Eye Near:   Left Eye Near:    Bilateral Near:     Physical Exam HENT:     Head: Normocephalic and atraumatic.     Right Ear: Tenderness present. No laceration, drainage or swelling. There is no impacted cerumen. No foreign body. Tympanic membrane is not erythematous or bulging.     Left Ear: Tympanic membrane normal. No laceration, drainage, swelling or tenderness. There is no impacted cerumen. No foreign body. Tympanic membrane is not erythematous or  bulging.      UC Treatments / Results  Labs (all labs ordered are listed, but only abnormal results are displayed) Labs Reviewed - No data to display  EKG   Radiology No results found.  Procedures Procedures (including critical care time)  Medications Ordered in UC Medications - No data to display  Initial Impression / Assessment and Plan / UC Course  I have reviewed the triage vital signs and the nursing notes.  Pertinent labs & imaging results that were available during my care of the patient were reviewed by me and considered in my medical decision making (see chart for details).     Patient presents with 1 month of right ear pain.  Tympanic membrane's normal bilaterally.  No obvious erythema in the right ear canal but is tenderness with insertion of the speculum.  Has received oral antibiotics within the past month that did not resolve the issue.  Did not receive eardrops last time but has received the before.  Will prescribe Cipro HC eardrops.  Patient advised to talk to his primary care doctor  about ear nose and throat referral for repeated ear infections. Final Clinical Impressions(s) / UC Diagnoses   Final diagnoses:  Infective otitis externa of right ear     Discharge Instructions     Please use these ear drops for your outer ear infection as directed for one week.  Please talk to your primary care doctor about an ear nose and throat referral for repeated ear infections.     ED Prescriptions    Medication Sig Dispense Auth. Provider   ciprofloxacin-hydrocortisone (CIPRO HC OTIC) OTIC suspension Place 3 drops into the right ear 2 (two) times daily for 7 days. 10 mL Sandre Kitty, MD     PDMP not reviewed this encounter.   Sandre Kitty, MD 05/18/20 1009

## 2020-05-18 NOTE — Discharge Instructions (Signed)
Please use these ear drops for your outer ear infection as directed for one week.  Please talk to your primary care doctor about an ear nose and throat referral for repeated ear infections.

## 2020-05-18 NOTE — ED Triage Notes (Signed)
Pt has hx of ear infection ands his right here has been hurting and draining for about a month and was on antibiotics and pain is back again.

## 2020-08-31 ENCOUNTER — Other Ambulatory Visit: Payer: Self-pay

## 2020-08-31 ENCOUNTER — Ambulatory Visit
Admission: RE | Admit: 2020-08-31 | Discharge: 2020-08-31 | Disposition: A | Discharge status: Home / Self Care | Payer: BC Managed Care – PPO | Source: Ambulatory Visit

## 2020-08-31 VITALS — HR 72 | Temp 97.9°F

## 2020-08-31 DIAGNOSIS — M545 Low back pain, unspecified: Secondary | ICD-10-CM | POA: Diagnosis not present

## 2020-08-31 DIAGNOSIS — M546 Pain in thoracic spine: Secondary | ICD-10-CM

## 2020-08-31 DIAGNOSIS — H66011 Acute suppurative otitis media with spontaneous rupture of ear drum, right ear: Secondary | ICD-10-CM | POA: Diagnosis not present

## 2020-08-31 DIAGNOSIS — M25572 Pain in left ankle and joints of left foot: Secondary | ICD-10-CM

## 2020-08-31 MED ORDER — IBUPROFEN 800 MG PO TABS: 800.0000 mg | ORAL_TABLET | Freq: Three times a day (TID) | ORAL | 0 refills | Status: DC

## 2020-08-31 MED ORDER — AMOXICILLIN 500 MG PO CAPS: 1000.0000 mg | ORAL_CAPSULE | Freq: Two times a day (BID) | ORAL | 0 refills | Status: AC

## 2020-08-31 MED ORDER — TIZANIDINE HCL 2 MG PO TABS: 2.0000 mg | ORAL_TABLET | Freq: Four times a day (QID) | ORAL | 0 refills | Status: DC | PRN
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
## 2020-08-31 NOTE — Discharge Instructions (Addendum)
Use anti-inflammatories for pain/swelling. You may take up to 800 mg Ibuprofen every 8 hours with food. You may supplement Ibuprofen with Tylenol [phone] mg every 8 hours.   May supplement tizanidine-this is a muscle relaxer, may cause drowsiness, do not drive or work after taking Alternate ice and heat to back Ice ankle Ace wrap for compression/extra support to ankle Amoxicillin for ear infection Follow-up if not improving or worsening

## 2020-12-05 ENCOUNTER — Encounter: Payer: Self-pay | Admitting: Emergency Medicine

## 2020-12-05 ENCOUNTER — Ambulatory Visit
Admission: EM | Admit: 2020-12-05 | Discharge: 2020-12-05 | Disposition: A | Discharge status: Home / Self Care | Payer: BC Managed Care – PPO | Attending: Physician Assistant | Admitting: Physician Assistant

## 2020-12-05 ENCOUNTER — Other Ambulatory Visit: Payer: Self-pay

## 2020-12-05 DIAGNOSIS — M62838 Other muscle spasm: Secondary | ICD-10-CM

## 2020-12-05 DIAGNOSIS — M542 Cervicalgia: Secondary | ICD-10-CM | POA: Diagnosis not present

## 2020-12-05 MED ORDER — TIZANIDINE HCL 4 MG PO CAPS: 4.0000 mg | ORAL_CAPSULE | Freq: Three times a day (TID) | ORAL | 0 refills | Status: AC | PRN

## 2020-12-05 NOTE — Discharge Instructions (Addendum)
Take tizanidine up to 3 times a day as needed.  This will make you sleepy do not drive or drink alcohol with it.  Alternate Tylenol and ibuprofen.  I also recommend heat, rest, stretch.  If you have any worsening symptoms including increased pain, fever, nausea, vomiting, vision changes you need to be seen immediately.

## 2020-12-05 NOTE — ED Triage Notes (Signed)
Right shoulder pain extending into neck. Hx of a pinched nerve. Says this has happened before and they gave him a muscle relaxer that relieved the pain. No obvious injury.

## 2020-12-05 NOTE — ED Provider Notes (Signed)
[facility]    CSN: [id number] Arrival date & time: 12/05/20  1514      History   Chief Complaint Chief Complaint  Patient presents with  . Shoulder Pain    HPI Michael Garza is a 34 y.o. male.   Patient presents today with a 1 day history of neck pain and spasms.  He reports pain is rated 8 on a 0-10 pain scale, localized to right neck with radiation towards right shoulder, described as aching, no aggravating relieving factors identified.  He does have a history of pinched nerve and has been prescribed gabapentin as well as pain medication which provides improvement of symptoms.  He has taken muscle relaxers in the past that provided relief of symptoms.  He is requesting a refill of tizanidine if appropriate today.  Denies any weakness, numbness, paresthesias.  He denies any known injury or increase in activity prior to symptom onset.   History reviewed. No pertinent past medical history.  There are no problems to display for this patient.   Past Surgical History:  Procedure Laterality Date  . LEG SURGERY    . TONSILLECTOMY         Home Medications    Prior to Admission medications   Medication Sig Start Date End Date Taking? Authorizing Provider  busPIRone (BUSPAR) 10 MG tablet Take 10 mg by mouth 3 (three) times daily.   Yes [provider]  oxymorphone (OPANA ER) 10 MG 12 hr tablet Take 10 mg by mouth every 12 (twelve) hours. States takes 1 tan q 4hrs   Yes [provider]  tiZANidine (ZANAFLEX) 4 MG capsule Take 1 capsule (4 mg total) by mouth 3 (three) times daily as needed for muscle spasms. 12/05/20  Yes Stephanie Littman K, PA-C  acetic acid 2 % otic solution Place 4 drops into the right ear every 3 (three) hours. 05/18/20   Sandre Kitty, MD  gabapentin (NEURONTIN) 300 MG capsule Take 300 mg by mouth 3 (three) times daily.    [provider]  ibuprofen (ADVIL) 800 MG tablet Take 1 tablet (800 mg total) by mouth 3 (three) times  daily. 08/31/20   Wieters, Junius Creamer, PA-C    Family History Family History  Problem Relation Age of Onset  . Diabetes Other   . Hypertension Other     Social History Social History   Tobacco Use  . Smoking status: Former    Packs/day: 1.00    Types: Cigarettes  . Smokeless tobacco: Never  Substance Use Topics  . Alcohol use: Yes    Comment: occasional  . Drug use: No     Allergies   Patient has no known allergies.   Review of Systems Review of Systems  Constitutional:  Positive for activity change. Negative for appetite change, fatigue and fever.  Respiratory:  Negative for cough and shortness of breath.   Cardiovascular:  Negative for chest pain.  Gastrointestinal:  Negative for abdominal pain, diarrhea, nausea and rectal pain.  Musculoskeletal:  Positive for arthralgias and neck pain. Negative for back pain.  Neurological:  Negative for dizziness, weakness, light-headedness, numbness and headaches.    Physical Exam Triage Vital Signs ED Triage Vitals [12/05/20 1615]  Enc Vitals Group     BP 122/81     Pulse Rate 76     Resp 18     Temp 97.9 F (36.6 C)     Temp Source Oral     SpO2 98 %  Weight      Height      Head Circumference      Peak Flow      Pain Score 8     Pain Loc      Pain Edu?      Excl. in GC?    No data found.  Updated Vital Signs BP 122/81 (BP Location: Right Arm)   Pulse 76   Temp 97.9 F (36.6 C) (Oral)   Resp 18   SpO2 98%   Visual Acuity Right Eye Distance:   Left Eye Distance:   Bilateral Distance:    Right Eye Near:   Left Eye Near:    Bilateral Near:     Physical Exam Vitals reviewed.  Constitutional:      General: He is awake.     Appearance: Normal appearance. He is normal weight. He is not ill-appearing.     Comments: Very pleasant male appears stated age in no acute distress sitting comfortably in exam room  HENT:     Head: Normocephalic and atraumatic.     Mouth/Throat:     Pharynx: No oropharyngeal  exudate, posterior oropharyngeal erythema or uvula swelling.  Cardiovascular:     Rate and Rhythm: Normal rate and regular rhythm.     Heart sounds: Normal heart sounds, S1 normal and S2 normal. No murmur heard. Pulmonary:     Effort: Pulmonary effort is normal.     Breath sounds: Normal breath sounds. No stridor. No wheezing, rhonchi or rales.     Comments: Clear to auscultation bilaterally Abdominal:     General: Bowel sounds are normal.     Palpations: Abdomen is soft.     Tenderness: There is no abdominal tenderness.  Musculoskeletal:     Cervical back: Spasms and tenderness present. No bony tenderness.     Thoracic back: No tenderness or bony tenderness.     Lumbar back: No tenderness or bony tenderness.     Comments: Neck: Normal active range of motion.  No pain percussion of vertebrae.  Tenderness palpation along trapezius bilaterally.  Spasm noted along right trapezius.  Strength 5/5 bilateral upper extremities.  Normal pincer and grip strength.  Normal active range of motion at shoulder.  Neurological:     Mental Status: He is alert.  Psychiatric:        Behavior: Behavior is cooperative.     UC Treatments / Results  Labs (all labs ordered are listed, but only abnormal results are displayed) Labs Reviewed - No data to display  EKG   Radiology No results found.  Procedures Procedures (including critical care time)  Medications Ordered in UC Medications - No data to display  Initial Impression / Assessment and Plan / UC Course  I have reviewed the triage vital signs and the nursing notes.  Pertinent labs & imaging results that were available during my care of the patient were reviewed by me and considered in my medical decision making (see chart for details).      Patient was given refill of tizanidine to help manage symptoms.  He was instructed not to drive or alcohol with this medication as drowsiness is a common side effect.  Recommended to use  conservative treatment measures including heat, rest, massage for additional symptom relief.  Discussed that if he has any worsening symptoms including weakness, numbness, paresthesias he needs to be seen immediately.  Strict return precautions given to which patient expressed understanding.  Final Clinical Impressions(s) / UC Diagnoses  Final diagnoses:  Neck pain  Muscle spasm     Discharge Instructions      Take tizanidine up to 3 times a day as needed.  This will make you sleepy do not drive or drink alcohol with it.  Alternate Tylenol and ibuprofen.  I also recommend heat, rest, stretch.  If you have any worsening symptoms including increased pain, fever, nausea, vomiting, vision changes you need to be seen immediately.     ED Prescriptions     Medication Sig Dispense Auth. Provider   tiZANidine (ZANAFLEX) 4 MG capsule Take 1 capsule (4 mg total) by mouth 3 (three) times daily as needed for muscle spasms. 30 capsule Dameian Crisman K, PA-C      PDMP not reviewed this encounter.   Jeani Hawking, PA-C 12/05/20 1643

## 2020-12-19 ENCOUNTER — Other Ambulatory Visit: Payer: Self-pay

## 2020-12-19 ENCOUNTER — Ambulatory Visit
Admission: EM | Admit: 2020-12-19 | Discharge: 2020-12-19 | Disposition: A | Discharge status: Home / Self Care | Payer: BC Managed Care – PPO | Attending: Internal Medicine | Admitting: Internal Medicine

## 2020-12-19 ENCOUNTER — Encounter: Payer: Self-pay | Admitting: Emergency Medicine

## 2020-12-19 DIAGNOSIS — M549 Dorsalgia, unspecified: Secondary | ICD-10-CM | POA: Diagnosis not present

## 2020-12-19 DIAGNOSIS — G8929 Other chronic pain: Secondary | ICD-10-CM

## 2020-12-19 DIAGNOSIS — M62838 Other muscle spasm: Secondary | ICD-10-CM | POA: Diagnosis not present

## 2020-12-19 DIAGNOSIS — M542 Cervicalgia: Secondary | ICD-10-CM | POA: Diagnosis not present

## 2020-12-19 MED ORDER — IBUPROFEN 800 MG PO TABS: 800.0000 mg | ORAL_TABLET | Freq: Three times a day (TID) | ORAL | 0 refills | Status: DC | PRN

## 2020-12-19 NOTE — ED Provider Notes (Signed)
[facility]    CSN: [id number] Arrival date & time: 12/19/20  1143      History   Chief Complaint Chief Complaint  Patient presents with   Back Pain    HPI Michael Garza is a 35 y.o. male.   Patient presents with neck, back, shoulder pain and spasming that has been present for approximately 3 to 4 days.  Denies any recent or past injuries.  Patient states that this is a chronic pain that flares up approximately every few weeks.  Has received cortisone injections in the past.  Patient is requesting refill on muscle relaxer that was prescribed at previous urgent care visit.  Denies any numbness or tingling.  Denies any chest pain or shortness of breath.  Denies any urinary burning, frequency, urinary or bowel incontinence.   Back Pain  History reviewed. No pertinent past medical history.  There are no problems to display for this patient.   Past Surgical History:  Procedure Laterality Date   LEG SURGERY     TONSILLECTOMY         Home Medications    Prior to Admission medications   Medication Sig Start Date End Date Taking? Authorizing Provider  acetic acid 2 % otic solution Place 4 drops into the right ear every 3 (three) hours. 05/18/20   Sandre Kitty, MD  busPIRone (BUSPAR) 10 MG tablet Take 10 mg by mouth 3 (three) times daily.    [provider]  gabapentin (NEURONTIN) 300 MG capsule Take 300 mg by mouth 3 (three) times daily. Patient not taking: Reported on 12/19/2020    [provider]  ibuprofen (ADVIL) 800 MG tablet Take 1 tablet (800 mg total) by mouth 3 (three) times daily as needed for mild pain or moderate pain. 12/19/20   Lance Muss, FNP  oxymorphone (OPANA ER) 10 MG 12 hr tablet Take 10 mg by mouth every 12 (twelve) hours. States takes 1 tan q 4hrs Patient not taking: Reported on 12/19/2020    [provider]  tiZANidine (ZANAFLEX) 4 MG capsule Take 1 capsule (4 mg total) by mouth 3 (three) times daily as needed  for muscle spasms. Patient not taking: Reported on 12/19/2020 12/05/20   Raspet, Noberto Retort, PA-C    Family History Family History  Problem Relation Age of Onset   Diabetes Other    Hypertension Other     Social History Social History   Tobacco Use   Smoking status: Former    Packs/day: 1.00    Types: Cigarettes   Smokeless tobacco: Never  Substance Use Topics   Alcohol use: Yes    Comment: occasional   Drug use: No     Allergies   Patient has no known allergies.   Review of Systems Review of Systems Per HPI  Physical Exam Triage Vital Signs ED Triage Vitals  Enc Vitals Group     BP 12/19/20 1237 (!) 135/93     Pulse Rate 12/19/20 1237 73     Resp 12/19/20 1237 18     Temp 12/19/20 1237 98.1 F (36.7 C)     Temp Source 12/19/20 1237 Oral     SpO2 12/19/20 1237 97 %     Weight --      Height --      Head Circumference --      Peak Flow --      Pain Score 12/19/20 1238 7     Pain Loc --  Pain Edu? --      Excl. in GC? --    No data found.  Updated Vital Signs BP (!) 135/93 (BP Location: Left Arm)   Pulse 73   Temp 98.1 F (36.7 C) (Oral)   Resp 18   SpO2 97%   Visual Acuity Right Eye Distance:   Left Eye Distance:   Bilateral Distance:    Right Eye Near:   Left Eye Near:    Bilateral Near:     Physical Exam Constitutional:      Appearance: Normal appearance.  HENT:     Head: Normocephalic and atraumatic.  Eyes:     Extraocular Movements: Extraocular movements intact.     Conjunctiva/sclera: Conjunctivae normal.  Pulmonary:     Effort: Pulmonary effort is normal.  Musculoskeletal:     Comments: Tenderness to palpation that starts in left neck muscles and radiates down throughout paraspinal muscles to back to lumbar region.  Neurovascular intact.  Neurological:     General: No focal deficit present.     Mental Status: He is alert and oriented to person, place, and time. Mental status is at baseline.     Deep Tendon Reflexes: Reflexes  are normal and symmetric.  Psychiatric:        Mood and Affect: Mood normal.        Behavior: Behavior normal.        Thought Content: Thought content normal.        Judgment: Judgment normal.     UC Treatments / Results  Labs (all labs ordered are listed, but only abnormal results are displayed) Labs Reviewed - No data to display  EKG   Radiology No results found.  Procedures Procedures (including critical care time)  Medications Ordered in UC Medications - No data to display  Initial Impression / Assessment and Plan / UC Course  I have reviewed the triage vital signs and the nursing notes.  Pertinent labs & imaging results that were available during my care of the patient were reviewed by me and considered in my medical decision making (see chart for details).     Patient will need follow-up with orthopedic specialist due to chronicity of pain.  Provided patient with contact information for orthopedic specialist.  Offered patient Toradol injection as well as prednisone steroid but patient declined both.  Unable to prescribe tizanidine or any other muscle relaxer due to safety with patient's daily oxymorphone prescription.  Patient is currently followed by pain clinic.  Patient was prescribed 800 mg ibuprofen to take as needed for pain and inflammation.  Advised patient to avoid any other over-the-counter NSAIDs while taking this ibuprofen.  Patient to alternate ice and heat application to affected area of pain. Discussed strict return precautions. Patient verbalized understanding and is agreeable with plan.  Final Clinical Impressions(s) / UC Diagnoses   Final diagnoses:  Neck pain  Other chronic back pain  Muscle spasm     Discharge Instructions      You have been prescribed ibuprofen to decrease pain and inflammation.  Do not take any additional over-the-counter ibuprofen, Advil, Aleve while taking this prescription.  Alternate ice and heat application to affected  area of pain.  Please follow-up with provided contact formation for [facility] orthopedic sports medicine since pain is chronic in nature.     ED Prescriptions     Medication Sig Dispense Auth. Provider   ibuprofen (ADVIL) 800 MG tablet Take 1 tablet (800 mg total) by mouth 3 (three) times  daily as needed for mild pain or moderate pain. 21 tablet Lance Muss, FNP      PDMP not reviewed this encounter.   Lance Muss, FNP 12/19/20 1322

## 2020-12-19 NOTE — ED Triage Notes (Signed)
Pt sts neck, back and shoulder pain that is chronic in nature worse over last 3-4 days; denies injury

## 2020-12-19 NOTE — Discharge Instructions (Signed)
You have been prescribed ibuprofen to decrease pain and inflammation.  Do not take any additional over-the-counter ibuprofen, Advil, Aleve while taking this prescription.  Alternate ice and heat application to affected area of pain.  Please follow-up with provided contact formation for [facility] orthopedic sports medicine since pain is chronic in nature.

## 2021-02-18 ENCOUNTER — Encounter: Payer: Self-pay | Admitting: Emergency Medicine

## 2021-02-18 ENCOUNTER — Ambulatory Visit
Admission: EM | Admit: 2021-02-18 | Discharge: 2021-02-18 | Disposition: A | Discharge status: Home / Self Care | Payer: BC Managed Care – PPO | Attending: Physician Assistant | Admitting: Physician Assistant

## 2021-02-18 ENCOUNTER — Other Ambulatory Visit: Payer: Self-pay

## 2021-02-18 DIAGNOSIS — Z202 Contact with and (suspected) exposure to infections with a predominantly sexual mode of transmission: Secondary | ICD-10-CM | POA: Diagnosis not present

## 2021-02-18 DIAGNOSIS — Z113 Encounter for screening for infections with a predominantly sexual mode of transmission: Secondary | ICD-10-CM | POA: Diagnosis not present

## 2021-02-18 DIAGNOSIS — M545 Low back pain, unspecified: Secondary | ICD-10-CM | POA: Insufficient documentation

## 2021-02-18 DIAGNOSIS — G8929 Other chronic pain: Secondary | ICD-10-CM | POA: Diagnosis not present

## 2021-02-18 MED ORDER — DOXYCYCLINE HYCLATE 100 MG PO CAPS: 100.0000 mg | ORAL_CAPSULE | Freq: Two times a day (BID) | ORAL | 0 refills | Status: DC

## 2021-02-18 NOTE — ED Provider Notes (Signed)
[facility]    CSN: [id number] Arrival date & time: 02/18/21  1221      History   Chief Complaint Chief Complaint  Patient presents with   SEXUALLY TRANSMITTED DISEASE    HPI Michael Garza is a 35 y.o. male.   Patient here today for STD screening after he reports his girlfriend notified him she tested positive for Chlamydia. Patient denies any concerning symptoms including penile discharge, abdominal pain, etc.   He has had some back pain, and states current pain is like flares he has experienced in the past. He has prescription for pain meds. He would note for work for 3 days of rest if possible.   The history is provided by the patient.   History reviewed. No pertinent past medical history.  There are no problems to display for this patient.   Past Surgical History:  Procedure Laterality Date   LEG SURGERY     TONSILLECTOMY         Home Medications    Prior to Admission medications   Medication Sig Start Date End Date Taking? Authorizing Provider  doxycycline (VIBRAMYCIN) 100 MG capsule Take 1 capsule (100 mg total) by mouth 2 (two) times daily. 02/18/21  Yes Tomi Bamberger, PA-C  acetic acid 2 % otic solution Place 4 drops into the right ear every 3 (three) hours. 05/18/20   Sandre Kitty, MD  busPIRone (BUSPAR) 10 MG tablet Take 10 mg by mouth 3 (three) times daily.    [provider]  gabapentin (NEURONTIN) 300 MG capsule Take 300 mg by mouth 3 (three) times daily. Patient not taking: Reported on 12/19/2020    [provider]  ibuprofen (ADVIL) 800 MG tablet Take 1 tablet (800 mg total) by mouth 3 (three) times daily as needed for mild pain or moderate pain. 12/19/20   Lance Muss, FNP  oxymorphone (OPANA ER) 10 MG 12 hr tablet Take 10 mg by mouth every 12 (twelve) hours. States takes 1 tan q 4hrs Patient not taking: Reported on 12/19/2020    [provider]  tiZANidine (ZANAFLEX) 4 MG capsule Take 1 capsule (4 mg  total) by mouth 3 (three) times daily as needed for muscle spasms. Patient not taking: Reported on 12/19/2020 12/05/20   Raspet, Noberto Retort, PA-C    Family History Family History  Problem Relation Age of Onset   Diabetes Other    Hypertension Other     Social History Social History   Tobacco Use   Smoking status: Former    Packs/day: 1.00    Types: Cigarettes   Smokeless tobacco: Never  Substance Use Topics   Alcohol use: Yes    Comment: occasional   Drug use: No     Allergies   Patient has no known allergies.   Review of Systems Review of Systems  Constitutional:  Negative for chills and fever.  Eyes:  Negative for discharge and redness.  Genitourinary:  Negative for genital sores and penile discharge.  Musculoskeletal:  Positive for back pain.    Physical Exam Triage Vital Signs ED Triage Vitals  Enc Vitals Group     BP 02/18/21 1253 133/86     Pulse Rate 02/18/21 1253 91     Resp 02/18/21 1253 20     Temp 02/18/21 1253 98 F (36.7 C)     Temp Source 02/18/21 1253 Oral     SpO2 02/18/21 1253 96 %     Weight --  Height --      Head Circumference --      Peak Flow --      Pain Score 02/18/21 1301 0     Pain Loc --      Pain Edu? --      Excl. in GC? --    No data found.  Updated Vital Signs BP 133/86 (BP Location: Left Arm)   Pulse 91   Temp 98 F (36.7 C) (Oral)   Resp 20   SpO2 96%     Physical Exam Vitals and nursing note reviewed.  Constitutional:      General: He is not in acute distress.    Appearance: Normal appearance. He is not ill-appearing.  HENT:     Head: Normocephalic and atraumatic.  Eyes:     Conjunctiva/sclera: Conjunctivae normal.  Cardiovascular:     Rate and Rhythm: Normal rate.  Pulmonary:     Effort: Pulmonary effort is normal.  Neurological:     Mental Status: He is alert.  Psychiatric:        Mood and Affect: Mood normal.        Behavior: Behavior normal.     UC Treatments / Results  Labs (all labs  ordered are listed, but only abnormal results are displayed) Labs Reviewed  CYTOLOGY, (ORAL, ANAL, URETHRAL) ANCILLARY ONLY    EKG   Radiology No results found.  Procedures Procedures (including critical care time)  Medications Ordered in UC Medications - No data to display  Initial Impression / Assessment and Plan / UC Course  I have reviewed the triage vital signs and the nursing notes.  Pertinent labs & imaging results that were available during my care of the patient were reviewed by me and considered in my medical decision making (see chart for details).   STD screening ordered and doxycycline prescribed for known exposure to chlamydia. Advised patient to remain abstinent until medication completed. Work note given as requested.   Final Clinical Impressions(s) / UC Diagnoses   Final diagnoses:  Chronic bilateral low back pain, unspecified whether sciatica present  Screening for STD (sexually transmitted disease)  Exposure to chlamydia   Discharge Instructions   None    ED Prescriptions     Medication Sig Dispense Auth. Provider   doxycycline (VIBRAMYCIN) 100 MG capsule Take 1 capsule (100 mg total) by mouth 2 (two) times daily. 20 capsule Tomi Bamberger, PA-C      PDMP not reviewed this encounter.   Tomi Bamberger, PA-C 02/18/21 1408

## 2021-02-18 NOTE — ED Triage Notes (Signed)
Girlfriend recently dx with chlamydia, requesting to be tested and treated.

## 2021-02-21 ENCOUNTER — Telehealth: Payer: Self-pay | Admitting: Emergency Medicine

## 2021-02-21 LAB — CYTOLOGY, (ORAL, ANAL, URETHRAL) ANCILLARY ONLY
Chlamydia: NEGATIVE
Comment: NEGATIVE
Comment: NEGATIVE
Comment: NORMAL
Neisseria Gonorrhea: NEGATIVE
Trichomonas: NEGATIVE

## 2021-02-21 NOTE — Telephone Encounter (Signed)
Patient advised of negative lab results.

## 2021-03-01 ENCOUNTER — Ambulatory Visit
Admission: EM | Admit: 2021-03-01 | Discharge: 2021-03-01 | Disposition: A | Discharge status: Home / Self Care | Payer: BC Managed Care – PPO

## 2021-03-01 ENCOUNTER — Other Ambulatory Visit: Payer: Self-pay

## 2021-03-02 ENCOUNTER — Encounter: Payer: Self-pay | Admitting: Emergency Medicine

## 2021-03-02 ENCOUNTER — Ambulatory Visit
Admission: EM | Admit: 2021-03-02 | Discharge: 2021-03-02 | Disposition: A | Discharge status: Home / Self Care | Payer: BC Managed Care – PPO | Attending: Internal Medicine | Admitting: Internal Medicine

## 2021-03-02 ENCOUNTER — Other Ambulatory Visit: Payer: Self-pay

## 2021-03-02 DIAGNOSIS — Z20822 Contact with and (suspected) exposure to covid-19: Secondary | ICD-10-CM | POA: Insufficient documentation

## 2021-03-02 DIAGNOSIS — J029 Acute pharyngitis, unspecified: Secondary | ICD-10-CM | POA: Insufficient documentation

## 2021-03-02 DIAGNOSIS — J069 Acute upper respiratory infection, unspecified: Secondary | ICD-10-CM | POA: Insufficient documentation

## 2021-03-02 LAB — POCT RAPID STREP A (OFFICE): Rapid Strep A Screen: NEGATIVE

## 2021-03-02 MED ORDER — BENZONATATE 100 MG PO CAPS: 100.0000 mg | ORAL_CAPSULE | Freq: Three times a day (TID) | ORAL | 0 refills | Status: DC | PRN

## 2021-03-02 MED ORDER — FLUTICASONE PROPIONATE 50 MCG/ACT NA SUSP: 1.0000 | Freq: Every day | NASAL | 0 refills | Status: AC

## 2021-03-02 NOTE — ED Provider Notes (Signed)
[facility]    CSN: [id number] Arrival date & time: 03/02/21  1705      History   Chief Complaint Chief Complaint  Patient presents with   Cough    HPI Michael Garza is a 35 y.o. male.   Patient presents with productive cough, throat, nasal congestion that has been present for 2 days.  Cough is productive with yellow sputum.  Denies any known fevers.  Children have similar symptoms.  Denies chest pain, shortness of breath, nausea, vomiting, diarrhea, abdominal pain.   Cough  History reviewed. No pertinent past medical history.  There are no problems to display for this patient.   Past Surgical History:  Procedure Laterality Date   LEG SURGERY     TONSILLECTOMY         Home Medications    Prior to Admission medications   Medication Sig Start Date End Date Taking? Authorizing Provider  benzonatate (TESSALON) 100 MG capsule Take 1 capsule (100 mg total) by mouth every 8 (eight) hours as needed for cough. 03/02/21  Yes Tereasa Yilmaz, Rolly Salter E, FNP  fluticasone (FLONASE) 50 MCG/ACT nasal spray Place 1 spray into both nostrils daily. 03/02/21  Yes Anahla Bevis, Rolly Salter E, FNP  acetic acid 2 % otic solution Place 4 drops into the right ear every 3 (three) hours. 05/18/20   Sandre Kitty, MD  busPIRone (BUSPAR) 10 MG tablet Take 10 mg by mouth 3 (three) times daily.    [provider]  doxycycline (VIBRAMYCIN) 100 MG capsule Take 1 capsule (100 mg total) by mouth 2 (two) times daily. Patient not taking: Reported on 03/02/2021 02/18/21   Tomi Bamberger, PA-C  gabapentin (NEURONTIN) 300 MG capsule Take 300 mg by mouth 3 (three) times daily. Patient not taking: Reported on 12/19/2020    [provider]  ibuprofen (ADVIL) 800 MG tablet Take 1 tablet (800 mg total) by mouth 3 (three) times daily as needed for mild pain or moderate pain. 12/19/20   Gustavus Bryant, FNP  oxymorphone (OPANA ER) 10 MG 12 hr tablet Take 10 mg by mouth every 12 (twelve) hours. States  takes 1 tan q 4hrs Patient not taking: Reported on 12/19/2020    [provider]  tiZANidine (ZANAFLEX) 4 MG capsule Take 1 capsule (4 mg total) by mouth 3 (three) times daily as needed for muscle spasms. Patient not taking: Reported on 12/19/2020 12/05/20   Raspet, Noberto Retort, PA-C    Family History Family History  Problem Relation Age of Onset   Diabetes Other    Hypertension Other     Social History Social History   Tobacco Use   Smoking status: Former    Packs/day: 1.00    Types: Cigarettes   Smokeless tobacco: Never  Vaping Use   Vaping Use: Some days  Substance Use Topics   Alcohol use: Yes    Comment: occasional   Drug use: No     Allergies   Patient has no known allergies.   Review of Systems Review of Systems Per HPI  Physical Exam Triage Vital Signs ED Triage Vitals  Enc Vitals Group     BP 03/02/21 1856 113/74     Pulse Rate 03/02/21 1856 (!) 106     Resp 03/02/21 1856 20     Temp 03/02/21 1856 98.1 F (36.7 C)     Temp Source 03/02/21 1856 Oral     SpO2 03/02/21 1856 98 %     Weight --  Height --      Head Circumference --      Peak Flow --      Pain Score 03/02/21 1852 8     Pain Loc --      Pain Edu? --      Excl. in GC? --    No data found.  Updated Vital Signs BP 113/74 (BP Location: Left Arm)   Pulse (!) 106   Temp 98.1 F (36.7 C) (Oral)   Resp 20   SpO2 98%   Visual Acuity Right Eye Distance:   Left Eye Distance:   Bilateral Distance:    Right Eye Near:   Left Eye Near:    Bilateral Near:     Physical Exam Constitutional:      General: He is not in acute distress.    Appearance: Normal appearance. He is not toxic-appearing or diaphoretic.  HENT:     Head: Normocephalic and atraumatic.     Right Ear: Tympanic membrane and ear canal normal.     Left Ear: Tympanic membrane and ear canal normal.     Nose: Congestion present.     Mouth/Throat:     Mouth: Mucous membranes are moist.     Pharynx: Posterior  oropharyngeal erythema present.  Eyes:     Extraocular Movements: Extraocular movements intact.     Conjunctiva/sclera: Conjunctivae normal.     Pupils: Pupils are equal, round, and reactive to light.  Cardiovascular:     Rate and Rhythm: Normal rate and regular rhythm.     Pulses: Normal pulses.     Heart sounds: Normal heart sounds.  Pulmonary:     Effort: Pulmonary effort is normal. No respiratory distress.     Breath sounds: Normal breath sounds. No stridor. No wheezing, rhonchi or rales.  Abdominal:     General: Abdomen is flat. Bowel sounds are normal.     Palpations: Abdomen is soft.  Musculoskeletal:        General: Normal range of motion.     Cervical back: Normal range of motion.  Skin:    General: Skin is warm and dry.  Neurological:     General: No focal deficit present.     Mental Status: He is alert and oriented to person, place, and time. Mental status is at baseline.  Psychiatric:        Mood and Affect: Mood normal.        Behavior: Behavior normal.     UC Treatments / Results  Labs (all labs ordered are listed, but only abnormal results are displayed) Labs Reviewed  COVID-19, FLU A+B AND RSV  CULTURE, GROUP A STREP [facility])  POCT RAPID STREP A (OFFICE)    EKG   Radiology No results found.  Procedures Procedures (including critical care time)  Medications Ordered in UC Medications - No data to display  Initial Impression / Assessment and Plan / UC Course  I have reviewed the triage vital signs and the nursing notes.  Pertinent labs & imaging results that were available during my care of the patient were reviewed by me and considered in my medical decision making (see chart for details).     Patient presents with symptoms likely from a viral upper respiratory infection. Differential includes bacterial pneumonia, sinusitis, allergic rhinitis, Covid 19. Do not suspect underlying cardiopulmonary process.  Patient is nontoxic appearing and not in  need of emergent medical intervention.  Rapid strep test was negative.  Throat culture and COVID-19, flu, RSV swab  pending.  Recommended symptom control with over the counter medications that are safe with patient's daily medications.  Flonase and benzonatate prescribed to help alleviate patient symptoms.  Return if symptoms fail to improve in 1-2 weeks. Patient states understanding and is agreeable.  Discharged with PCP followup.  Final Clinical Impressions(s) / UC Diagnoses   Final diagnoses:  Viral upper respiratory tract infection with cough  Sore throat  Encounter for laboratory testing for COVID-19 virus     Discharge Instructions      Your symptoms are likely from a viral upper respiratory infection.  You have been prescribed 2 medications to help alleviate symptoms.  COVID-19, flu, RSV test are pending.  Rapid strep test was negative.  Throat culture is pending.  We will call if there are any positive.  Unable to prescribe cetirizine (Zyrtec) due to interactions with medications.     ED Prescriptions     Medication Sig Dispense Auth. Provider   fluticasone (FLONASE) 50 MCG/ACT nasal spray Place 1 spray into both nostrils daily. 16 g Jeshawn Melucci, Rolly Salter E, [place]   benzonatate (TESSALON) 100 MG capsule Take 1 capsule (100 mg total) by mouth every 8 (eight) hours as needed for cough. 21 capsule [place], Acie Fredrickson, [place]      PDMP not reviewed this encounter.   Gustavus Bryant, [place] 03/02/21 [phone]

## 2021-03-02 NOTE — Discharge Instructions (Signed)
Your symptoms are likely from a viral upper respiratory infection.  You have been prescribed 2 medications to help alleviate symptoms.  COVID-19, flu, RSV test are pending.  Rapid strep test was negative.  Throat culture is pending.  We will call if there are any positive.  Unable to prescribe cetirizine (Zyrtec) due to interactions with medications.

## 2021-03-02 NOTE — ED Triage Notes (Signed)
Cough , runny nose and congestion and sore throat for 2 days.

## 2021-03-03 LAB — COVID-19, FLU A+B AND RSV
Influenza A, NAA: NOT DETECTED
Influenza B, NAA: NOT DETECTED
RSV, NAA: NOT DETECTED
SARS-CoV-2, NAA: NOT DETECTED

## 2021-03-05 LAB — CULTURE, GROUP A STREP (THRC)

## 2021-03-09 ENCOUNTER — Ambulatory Visit
Admission: EM | Admit: 2021-03-09 | Discharge: 2021-03-09 | Disposition: A | Discharge status: Home / Self Care | Payer: BC Managed Care – PPO | Attending: Physician Assistant | Admitting: Physician Assistant

## 2021-03-09 ENCOUNTER — Other Ambulatory Visit: Payer: Self-pay

## 2021-03-09 DIAGNOSIS — J209 Acute bronchitis, unspecified: Secondary | ICD-10-CM | POA: Diagnosis not present

## 2021-03-09 DIAGNOSIS — J019 Acute sinusitis, unspecified: Secondary | ICD-10-CM | POA: Diagnosis not present

## 2021-03-09 MED ORDER — DOXYCYCLINE HYCLATE 100 MG PO CAPS: 100.0000 mg | ORAL_CAPSULE | Freq: Two times a day (BID) | ORAL | 0 refills | Status: DC

## 2021-03-09 MED ORDER — PREDNISONE 20 MG PO TABS: 40.0000 mg | ORAL_TABLET | Freq: Every day | ORAL | 0 refills | Status: AC

## 2021-03-09 NOTE — ED Notes (Signed)
Contacted patient, no answer, unable to leave a message, vm is full.

## 2021-03-09 NOTE — ED Provider Notes (Signed)
[facility]    CSN: [id number] Arrival date & time: 03/09/21  1506      History   Chief Complaint Chief Complaint  Patient presents with   Cough    HPI Michael Garza is a 35 y.o. male.   Patient here today for evaluation of 2-week history of nasal congestion and drainage, cough, and some chills.  He is not sure if he has had fever but states he did have some sweating last night and other nights last week.  Several members at work have been sick.  He has tried some over-the-counter treatment without significant relief.  The history is provided by the patient.  Cough Associated symptoms: chills, diaphoresis and sore throat   Associated symptoms: no ear pain, no eye discharge, no fever and no shortness of breath    History reviewed. No pertinent past medical history.  There are no problems to display for this patient.   Past Surgical History:  Procedure Laterality Date   LEG SURGERY     TONSILLECTOMY         Home Medications    Prior to Admission medications   Medication Sig Start Date End Date Taking? Authorizing Provider  doxycycline (VIBRAMYCIN) 100 MG capsule Take 1 capsule (100 mg total) by mouth 2 (two) times daily. 03/09/21  Yes Tomi Bamberger, PA-C  predniSONE (DELTASONE) 20 MG tablet Take 2 tablets (40 mg total) by mouth daily with breakfast for 5 days. 03/09/21 03/14/21 Yes Tomi Bamberger, PA-C  acetic acid 2 % otic solution Place 4 drops into the right ear every 3 (three) hours. 05/18/20   Sandre Kitty, MD  benzonatate (TESSALON) 100 MG capsule Take 1 capsule (100 mg total) by mouth every 8 (eight) hours as needed for cough. 03/02/21   Gustavus Bryant, FNP  busPIRone (BUSPAR) 10 MG tablet Take 10 mg by mouth 3 (three) times daily.    [provider]  fluticasone (FLONASE) 50 MCG/ACT nasal spray Place 1 spray into both nostrils daily. 03/02/21   Gustavus Bryant, FNP  gabapentin (NEURONTIN) 300 MG capsule Take 300 mg by mouth 3 (three)  times daily. Patient not taking: Reported on 12/19/2020    [provider]  ibuprofen (ADVIL) 800 MG tablet Take 1 tablet (800 mg total) by mouth 3 (three) times daily as needed for mild pain or moderate pain. 12/19/20   Gustavus Bryant, FNP  oxymorphone (OPANA ER) 10 MG 12 hr tablet Take 10 mg by mouth every 12 (twelve) hours. States takes 1 tan q 4hrs Patient not taking: Reported on 12/19/2020    [provider]  tiZANidine (ZANAFLEX) 4 MG capsule Take 1 capsule (4 mg total) by mouth 3 (three) times daily as needed for muscle spasms. Patient not taking: Reported on 12/19/2020 12/05/20   Raspet, Noberto Retort, PA-C    Family History Family History  Problem Relation Age of Onset   Diabetes Other    Hypertension Other     Social History Social History   Tobacco Use   Smoking status: Former    Packs/day: 1.00    Types: Cigarettes   Smokeless tobacco: Never  Vaping Use   Vaping Use: Some days  Substance Use Topics   Alcohol use: Yes    Comment: occasional   Drug use: No     Allergies   Patient has no known allergies.   Review of Systems Review of Systems  Constitutional:  Positive for chills and diaphoresis. Negative for fever.  HENT:  Positive for congestion and sore throat. Negative for ear pain.   Eyes:  Negative for discharge and redness.  Respiratory:  Positive for cough. Negative for shortness of breath.   Gastrointestinal:  Negative for abdominal pain, nausea and vomiting.    Physical Exam Triage Vital Signs ED Triage Vitals [03/09/21 1555]  Enc Vitals Group     BP (!) 153/88     Pulse Rate 93     Resp 18     Temp 98.1 F (36.7 C)     Temp Source Oral     SpO2 97 %     Weight      Height      Head Circumference      Peak Flow      Pain Score 0     Pain Loc      Pain Edu?      Excl. in GC?    No data found.  Updated Vital Signs BP (!) 153/88 (BP Location: Left Arm)   Pulse 93   Temp 98.1 F (36.7 C) (Oral)   Resp 18   SpO2 97%    Physical Exam Vitals and nursing note reviewed.  Constitutional:      General: He is not in acute distress.    Appearance: Normal appearance. He is not ill-appearing.  HENT:     Head: Normocephalic and atraumatic.     Nose: Nose normal. No congestion.  Eyes:     Conjunctiva/sclera: Conjunctivae normal.  Cardiovascular:     Rate and Rhythm: Normal rate and regular rhythm.     Heart sounds: Normal heart sounds. No murmur heard. Pulmonary:     Effort: Pulmonary effort is normal. No respiratory distress.     Breath sounds: Normal breath sounds. No wheezing, rhonchi or rales.  Skin:    General: Skin is warm and dry.  Neurological:     Mental Status: He is alert.  Psychiatric:        Mood and Affect: Mood normal.        Thought Content: Thought content normal.     UC Treatments / Results  Labs (all labs ordered are listed, but only abnormal results are displayed) Labs Reviewed - No data to display  EKG   Radiology No results found.  Procedures Procedures (including critical care time)  Medications Ordered in UC Medications - No data to display  Initial Impression / Assessment and Plan / UC Course  I have reviewed the triage vital signs and the nursing notes.  Pertinent labs & imaging results that were available during my care of the patient were reviewed by me and considered in my medical decision making (see chart for details).  Prednisone prescribed for suspected bronchitis and doxycycline to cover sinusitis or other pulmonary infection.  Recommended follow-up if symptoms fail to improve or worsen anyway.  Work note provided as requested.  Final Clinical Impressions(s) / UC Diagnoses   Final diagnoses:  Acute sinusitis, recurrence not specified, unspecified location  Acute bronchitis, unspecified organism   Discharge Instructions   None    ED Prescriptions     Medication Sig Dispense Auth. Provider   predniSONE (DELTASONE) 20 MG tablet Take 2 tablets (40  mg total) by mouth daily with breakfast for 5 days. 10 tablet Erma Pinto F, PA-C   doxycycline (VIBRAMYCIN) 100 MG capsule Take 1 capsule (100 mg total) by mouth 2 (two) times daily. 20 capsule Tomi Bamberger, PA-C      PDMP not  reviewed this encounter.   Tomi Bamberger, PA-C 03/09/21 1635

## 2021-03-09 NOTE — ED Triage Notes (Signed)
Pt c/o tightening in the chest, headache, sweating, coughing, runny nose. Onset 2 weeks ago

## 2021-10-27 ENCOUNTER — Ambulatory Visit
Admission: EM | Admit: 2021-10-27 | Discharge: 2021-10-27 | Disposition: A | Discharge status: Home / Self Care | Payer: BC Managed Care – PPO

## 2021-10-27 DIAGNOSIS — R197 Diarrhea, unspecified: Secondary | ICD-10-CM

## 2021-10-27 DIAGNOSIS — B349 Viral infection, unspecified: Secondary | ICD-10-CM | POA: Diagnosis not present

## 2021-10-27 DIAGNOSIS — H66011 Acute suppurative otitis media with spontaneous rupture of ear drum, right ear: Secondary | ICD-10-CM | POA: Diagnosis not present

## 2021-10-27 DIAGNOSIS — R112 Nausea with vomiting, unspecified: Secondary | ICD-10-CM | POA: Diagnosis not present

## 2021-10-27 MED ORDER — ONDANSETRON 4 MG PO TBDP
4.0000 mg | ORAL_TABLET | Freq: Once | ORAL | Status: AC
  Administered 2021-10-27: 4 mg via ORAL

## 2021-10-27 MED ORDER — AMOXICILLIN-POT CLAVULANATE 875-125 MG PO TABS: 1.0000 | ORAL_TABLET | Freq: Two times a day (BID) | ORAL | 0 refills | Status: DC

## 2021-10-27 NOTE — ED Provider Notes (Signed)
[facility]    CSN: [id number] Arrival date & time: 10/27/21  1000      History   Chief Complaint Chief Complaint  Patient presents with   Diarrhea    HPI Michael Garza is a 36 y.o. male.   Patient presents with nausea, vomiting, diarrhea, cough, nasal congestion, right ear discomfort and drainage that started about 3 days ago.  He reports he has been around family members with similar symptoms.  Denies any known fevers but reports that he felt feverish.  Denies chest pain, shortness of breath, sore throat, abdominal pain.  Patient is having difficulty keeping fluids down but is attempting to drink fluids.  Denies blood in stool or emesis.  Patient has not taken any medications to help alleviate symptoms.   Diarrhea   History reviewed. No pertinent past medical history.  There are no problems to display for this patient.   Past Surgical History:  Procedure Laterality Date   LEG SURGERY     TONSILLECTOMY         Home Medications    Prior to Admission medications   Medication Sig Start Date End Date Taking? Authorizing Provider  amoxicillin-clavulanate (AUGMENTIN) 875-125 MG tablet Take 1 tablet by mouth every 12 (twelve) hours. 10/27/21  Yes March Steyer, Rolly Salter E, FNP  acetic acid 2 % otic solution Place 4 drops into the right ear every 3 (three) hours. 05/18/20   Sandre Kitty, MD  benzonatate (TESSALON) 100 MG capsule Take 1 capsule (100 mg total) by mouth every 8 (eight) hours as needed for cough. 03/02/21   Gustavus Bryant, FNP  busPIRone (BUSPAR) 10 MG tablet Take 10 mg by mouth 3 (three) times daily.    [provider]  fluticasone (FLONASE) 50 MCG/ACT nasal spray Place 1 spray into both nostrils daily. 03/02/21   Gustavus Bryant, FNP  gabapentin (NEURONTIN) 300 MG capsule Take 300 mg by mouth 3 (three) times daily. Patient not taking: Reported on 12/19/2020    [provider]  ibuprofen (ADVIL) 800 MG tablet Take 1 tablet (800 mg total) by  mouth 3 (three) times daily as needed for mild pain or moderate pain. 12/19/20   Gustavus Bryant, FNP  omeprazole (PRILOSEC) 20 MG capsule omeprazole 20 mg capsule,delayed release    [provider]  oxymorphone (OPANA ER) 10 MG 12 hr tablet Take 10 mg by mouth every 12 (twelve) hours. States takes 1 tan q 4hrs Patient not taking: Reported on 12/19/2020    [provider]  tiZANidine (ZANAFLEX) 4 MG capsule Take 1 capsule (4 mg total) by mouth 3 (three) times daily as needed for muscle spasms. Patient not taking: Reported on 12/19/2020 12/05/20   Raspet, Noberto Retort, PA-C    Family History Family History  Problem Relation Age of Onset   Diabetes Other    Hypertension Other     Social History Social History   Tobacco Use   Smoking status: Former    Packs/day: 1.00    Types: Cigarettes   Smokeless tobacco: Never  Vaping Use   Vaping Use: Some days  Substance Use Topics   Alcohol use: Yes    Comment: occasional   Drug use: No     Allergies   Patient has no known allergies.   Review of Systems Review of Systems Per HPI  Physical Exam Triage Vital Signs ED Triage Vitals [10/27/21 1012]  Enc Vitals Group     BP (!) 145/95     Pulse  Rate 65     Resp 18     Temp 98 F (36.7 C)     Temp Source Oral     SpO2 98 %     Weight      Height      Head Circumference      Peak Flow      Pain Score 0     Pain Loc      Pain Edu?      Excl. in GC?    No data found.  Updated Vital Signs BP (!) 145/95 (BP Location: Left Arm)   Pulse 65   Temp 98 F (36.7 C) (Oral)   Resp 18   SpO2 98%   Visual Acuity Right Eye Distance:   Left Eye Distance:   Bilateral Distance:    Right Eye Near:   Left Eye Near:    Bilateral Near:     Physical Exam Constitutional:      General: He is not in acute distress.    Appearance: Normal appearance. He is not toxic-appearing or diaphoretic.  HENT:     Head: Normocephalic and atraumatic.     Right Ear: Ear canal normal. No  drainage, swelling or tenderness. No middle ear effusion. No foreign body. No mastoid tenderness. Tympanic membrane is perforated and erythematous. Tympanic membrane is not bulging.     Left Ear: Tympanic membrane and ear canal normal.     Nose: Congestion present.     Mouth/Throat:     Mouth: Mucous membranes are moist.     Pharynx: No posterior oropharyngeal erythema.  Eyes:     Extraocular Movements: Extraocular movements intact.     Conjunctiva/sclera: Conjunctivae normal.     Pupils: Pupils are equal, round, and reactive to light.  Cardiovascular:     Rate and Rhythm: Normal rate and regular rhythm.     Pulses: Normal pulses.     Heart sounds: Normal heart sounds.  Pulmonary:     Effort: Pulmonary effort is normal. No respiratory distress.     Breath sounds: Normal breath sounds. No stridor. No wheezing, rhonchi or rales.  Abdominal:     General: Abdomen is flat. Bowel sounds are normal. There is no distension.     Palpations: Abdomen is soft.     Tenderness: There is no abdominal tenderness.  Musculoskeletal:        General: Normal range of motion.     Cervical back: Normal range of motion.  Skin:    General: Skin is warm and dry.  Neurological:     General: No focal deficit present.     Mental Status: He is alert and oriented to person, place, and time. Mental status is at baseline.  Psychiatric:        Mood and Affect: Mood normal.        Behavior: Behavior normal.      UC Treatments / Results  Labs (all labs ordered are listed, but only abnormal results are displayed) Labs Reviewed - No data to display  EKG   Radiology No results found.  Procedures Procedures (including critical care time)  Medications Ordered in UC Medications  ondansetron (ZOFRAN-ODT) disintegrating tablet 4 mg (4 mg Oral Given 10/27/21 1032)    Initial Impression / Assessment and Plan / UC Course  I have reviewed the triage vital signs and the nursing notes.  Pertinent labs &  imaging results that were available during my care of the patient were reviewed by me and considered  in my medical decision making (see chart for details).     Patient presenting with a viral illness that is causing symptoms.  Patient also has right otitis media with perforation.  Will treat with Augmentin.  Patient advised to follow-up with provided contact information for ENT specialty for further evaluation and management of this.  Patient given ondansetron nausea in urgent care. Unable to prescribe home course of ondansetron given that it interacts with patient's daily medications.  I do think this is reasonable as patient only reports mild nausea and that nausea, vomiting, diarrhea is starting to resolve.  Patient advised to ensure adequate fluid hydration.  No signs of dehydration on exam.  Patient was given strict return and ER precautions.  Discussed supportive care and symptom management with patient.  Patient verbalized understanding and was agreeable with plan. Final Clinical Impressions(s) / UC Diagnoses   Final diagnoses:  Non-recurrent acute suppurative otitis media of right ear with spontaneous rupture of tympanic membrane  Viral illness  Nausea vomiting and diarrhea     Discharge Instructions      You have an ear infection with a ruptured eardrum.  This is being treated with an antibiotic.  Please also follow-up with provided contact information for ear, nose, throat specialist for further evaluation and management.  It appears that a viral illness has caused your symptoms.  You were given a nausea medication in urgent care today.  I am unable to prescribe nausea medication for you to take at home given that it may interact with your daily medications.  Please ensure adequate fluid hydration.  Go to the hospital if symptoms persist or worsen.    ED Prescriptions     Medication Sig Dispense Auth. Provider   amoxicillin-clavulanate (AUGMENTIN) 875-125 MG tablet Take 1 tablet by  mouth every 12 (twelve) hours. 14 tablet [place], Acie Fredrickson, [place]      PDMP not reviewed this encounter.   Gustavus Bryant, [place] 10/27/21 1055

## 2021-10-27 NOTE — Discharge Instructions (Signed)
You have an ear infection with a ruptured eardrum.  This is being treated with an antibiotic.  Please also follow-up with provided contact information for ear, nose, throat specialist for further evaluation and management.  It appears that a viral illness has caused your symptoms.  You were given a nausea medication in urgent care today.  I am unable to prescribe nausea medication for you to take at home given that it may interact with your daily medications.  Please ensure adequate fluid hydration.  Go to the hospital if symptoms persist or worsen.

## 2021-10-27 NOTE — ED Triage Notes (Signed)
Pt c/o headache, diarrhea x 3 days. Body aches, nausea, vomiting. Right ear drainage.

## 2021-11-01 DIAGNOSIS — H7290 Unspecified perforation of tympanic membrane, unspecified ear: Secondary | ICD-10-CM | POA: Insufficient documentation

## 2021-11-01 DIAGNOSIS — Z79899 Other long term (current) drug therapy: Secondary | ICD-10-CM | POA: Insufficient documentation

## 2022-03-01 ENCOUNTER — Ambulatory Visit
Admission: EM | Admit: 2022-03-01 | Discharge: 2022-03-01 | Disposition: A | Discharge status: Home / Self Care | Payer: BC Managed Care – PPO

## 2022-03-01 DIAGNOSIS — H6691 Otitis media, unspecified, right ear: Secondary | ICD-10-CM

## 2022-03-01 DIAGNOSIS — H7291 Unspecified perforation of tympanic membrane, right ear: Secondary | ICD-10-CM | POA: Diagnosis not present

## 2022-03-01 MED ORDER — AMOXICILLIN-POT CLAVULANATE 875-125 MG PO TABS: 1.0000 | ORAL_TABLET | Freq: Two times a day (BID) | ORAL | 0 refills | Status: DC

## 2022-03-01 NOTE — ED Triage Notes (Signed)
Pt c/o right otalgia onset ~ 2 weeks ago causing headache, ear drainage, dizziness.

## 2022-03-01 NOTE — Discharge Instructions (Signed)
I am treating your ear infection with antibiotics.  Please follow-up with your specialist at provided contact information as soon as possible for further evaluation and management.

## 2022-03-01 NOTE — ED Provider Notes (Signed)
[facility]    CSN: [id number] Arrival date & time: 03/01/22  1440      History   Chief Complaint Chief Complaint  Patient presents with   Otalgia    HPI Michael Garza is a 36 y.o. male.   Patient presents with right ear pain that started about 2 days ago.  Patient denies any associated left ear pain or upper respiratory symptoms, cough, fever.  Denies trauma, foreign body, decreased hearing, drainage from the ear.  Patient had similar ear symptoms in June 2023 and was treated with antibiotics.  It appears that he had a perforated eardrum at that time as well and he was advised to follow-up with ENT but never did this.   Otalgia   History reviewed. No pertinent past medical history.  There are no problems to display for this patient.   Past Surgical History:  Procedure Laterality Date   LEG SURGERY     TONSILLECTOMY         Home Medications    Prior to Admission medications   Medication Sig Start Date End Date Taking? Authorizing Provider  amoxicillin-clavulanate (AUGMENTIN) 875-125 MG tablet Take 1 tablet by mouth every 12 (twelve) hours. 03/01/22  Yes Burlon Centrella, Hildred Alamin E, FNP  acetic acid 2 % otic solution Place 4 drops into the right ear every 3 (three) hours. 05/18/20   Benay Pike, MD  busPIRone (BUSPAR) 10 MG tablet Take 10 mg by mouth 3 (three) times daily.    [provider]  fluticasone (FLONASE) 50 MCG/ACT nasal spray Place 1 spray into both nostrils daily. 03/02/21   Teodora Medici, FNP  gabapentin (NEURONTIN) 300 MG capsule Take 300 mg by mouth 3 (three) times daily. Patient not taking: Reported on 12/19/2020    [provider]  gabapentin (NEURONTIN) 600 MG tablet Take 600 mg by mouth 3 (three) times daily. 02/08/22   [provider]  HYDROcodone-acetaminophen (NORCO/VICODIN) 5-325 MG tablet Take 1 tablet by mouth every 6 (six) hours as needed.    [provider]  ibuprofen (ADVIL) 800 MG tablet Take 1  tablet (800 mg total) by mouth 3 (three) times daily as needed for mild pain or moderate pain. 12/19/20   Teodora Medici, FNP  omeprazole (PRILOSEC) 20 MG capsule omeprazole 20 mg capsule,delayed release    [provider]  oxyCODONE-acetaminophen (PERCOCET) 10-325 MG tablet Take 1 tablet by mouth every 4 (four) hours as needed. 02/07/22   [provider]  oxymorphone (OPANA ER) 10 MG 12 hr tablet Take 10 mg by mouth every 12 (twelve) hours. States takes 1 tan q 4hrs Patient not taking: Reported on 12/19/2020    [provider]  tiZANidine (ZANAFLEX) 4 MG capsule Take 1 capsule (4 mg total) by mouth 3 (three) times daily as needed for muscle spasms. Patient not taking: Reported on 12/19/2020 12/05/20   Raspet, Derry Skill, PA-C    Family History Family History  Problem Relation Age of Onset   Diabetes Other    Hypertension Other     Social History Social History   Tobacco Use   Smoking status: Former    Packs/day: 1.00    Types: Cigarettes   Smokeless tobacco: Never  Vaping Use   Vaping Use: Some days  Substance Use Topics   Alcohol use: Yes    Comment: occasional   Drug use: No     Allergies   Patient has no known allergies.   Review of Systems Review of Systems  Per HPI  Physical Exam Triage Vital Signs ED Triage Vitals [03/01/22 1456]  Enc Vitals Group     BP (!) 148/79     Pulse Rate 86     Resp 16     Temp 98 F (36.7 C)     Temp Source Oral     SpO2 98 %     Weight      Height      Head Circumference      Peak Flow      Pain Score 9     Pain Loc      Pain Edu?      Excl. in GC?    No data found.  Updated Vital Signs BP (!) 148/79 (BP Location: Right Arm)   Pulse 86   Temp 98 F (36.7 C) (Oral)   Resp 16   SpO2 98%   Visual Acuity Right Eye Distance:   Left Eye Distance:   Bilateral Distance:    Right Eye Near:   Left Eye Near:    Bilateral Near:     Physical Exam Constitutional:      General: He is not in acute  distress.    Appearance: Normal appearance. He is not toxic-appearing or diaphoretic.  HENT:     Head: Normocephalic and atraumatic.     Right Ear: Ear canal and external ear normal. No drainage, swelling or tenderness. A middle ear effusion is present. There is no impacted cerumen. No foreign body. Tympanic membrane is perforated and erythematous. Tympanic membrane is not bulging.     Ears:     Comments: Patient has possible perforation of eardrum to right lower TM.  Area is also mildly erythematous with cloudy fluid. Eyes:     Extraocular Movements: Extraocular movements intact.     Conjunctiva/sclera: Conjunctivae normal.  Pulmonary:     Effort: Pulmonary effort is normal.  Neurological:     General: No focal deficit present.     Mental Status: He is alert and oriented to person, place, and time. Mental status is at baseline.  Psychiatric:        Mood and Affect: Mood normal.        Behavior: Behavior normal.        Thought Content: Thought content normal.        Judgment: Judgment normal.      UC Treatments / Results  Labs (all labs ordered are listed, but only abnormal results are displayed) Labs Reviewed - No data to display  EKG   Radiology No results found.  Procedures Procedures (including critical care time)  Medications Ordered in UC Medications - No data to display  Initial Impression / Assessment and Plan / UC Course  I have reviewed the triage vital signs and the nursing notes.  Pertinent labs & imaging results that were available during my care of the patient were reviewed by me and considered in my medical decision making (see chart for details).     Patient has suspected perforation of TM to right ear.  It is difficult to determine if this is from previous perforation or new.  Ear is also mildly erythematous and has some cloudy fluid so will opt to treat with Augmentin antibiotic.  I do think patient would benefit from seeing ear, nose, and throat  specialist so he was provided with contact information for follow-up as soon as possible.  Discussed return precautions.  Patient verbalized understanding and was agreeable with plan. Final Clinical Impressions(s) /  UC Diagnoses   Final diagnoses:  Acute otitis media with perforated tympanic membrane, right     Discharge Instructions      I am treating your ear infection with antibiotics.  Please follow-up with your specialist at provided contact information as soon as possible for further evaluation and management.    ED Prescriptions     Medication Sig Dispense Auth. Provider   amoxicillin-clavulanate (AUGMENTIN) 875-125 MG tablet Take 1 tablet by mouth every 12 (twelve) hours. 14 tablet [place], Acie Fredrickson, [place]      PDMP not reviewed this encounter.   Gustavus Bryant, [place] 03/01/22 1527

## 2023-03-12 DIAGNOSIS — M5432 Sciatica, left side: Secondary | ICD-10-CM | POA: Insufficient documentation

## 2023-03-12 DIAGNOSIS — M5431 Sciatica, right side: Secondary | ICD-10-CM | POA: Insufficient documentation

## 2023-03-12 DIAGNOSIS — I10 Essential (primary) hypertension: Secondary | ICD-10-CM | POA: Insufficient documentation

## 2023-03-12 DIAGNOSIS — K219 Gastro-esophageal reflux disease without esophagitis: Secondary | ICD-10-CM | POA: Insufficient documentation

## 2023-03-12 DIAGNOSIS — M792 Neuralgia and neuritis, unspecified: Secondary | ICD-10-CM | POA: Insufficient documentation

## 2023-03-12 DIAGNOSIS — F419 Anxiety disorder, unspecified: Secondary | ICD-10-CM | POA: Insufficient documentation

## 2023-05-08 ENCOUNTER — Ambulatory Visit
Admission: EM | Admit: 2023-05-08 | Discharge: 2023-05-08 | Disposition: A | Discharge status: Home / Self Care | Payer: 59

## 2023-05-08 DIAGNOSIS — H60501 Unspecified acute noninfective otitis externa, right ear: Secondary | ICD-10-CM

## 2023-05-08 DIAGNOSIS — H65192 Other acute nonsuppurative otitis media, left ear: Secondary | ICD-10-CM

## 2023-05-08 MED ORDER — CIPROFLOXACIN-DEXAMETHASONE 0.3-0.1 % OT SUSP: 4.0000 [drp] | Freq: Two times a day (BID) | OTIC | 0 refills | Status: AC

## 2023-05-08 MED ORDER — AMOXICILLIN-POT CLAVULANATE 875-125 MG PO TABS: 1.0000 | ORAL_TABLET | Freq: Two times a day (BID) | ORAL | 0 refills | Status: DC

## 2023-05-08 NOTE — ED Triage Notes (Signed)
Here for "right ear pain with drainage as well". "It has been like this all week". No fever.

## 2023-05-13 NOTE — ED Provider Notes (Signed)
 [facility]    CSN: [id number] Arrival date & time: 05/08/23  1800      History   Chief Complaint Chief Complaint  Patient presents with   Otalgia    HPI Michael Garza is a 38 y.o. male.   Patient here today for evaluation of right ear pain with some drainage.  He reports that this is been present all week.  She denies any fever.  She has not any cough or congestion.  The history is provided by the patient.  Otalgia Associated symptoms: no congestion, no cough, no fever and no sore throat     History reviewed. No pertinent past medical history.  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 Authorizing Provider  amoxicillin -clavulanate (AUGMENTIN ) 875-125 MG tablet Take 1 tablet by mouth every 12 (twelve) hours. 05/08/23  Yes Billy Asberry FALCON, PA-C  Aspirin-Salicylamide-Caffeine (BC HEADACHE POWDER PO) Take by mouth.   Yes [provider]  busPIRone (BUSPAR) 15 MG tablet Take 15 mg by mouth 2 (two) times daily. 01/05/22  Yes [provider]  ciprofloxacin -dexamethasone  (CIPRODEX ) OTIC suspension Place 4 drops into the right ear 2 (two) times daily for 7 days. 05/08/23 05/15/23 Yes Billy Asberry FALCON, PA-C  cyclobenzaprine  (FLEXERIL ) 10 MG tablet Take 10 mg by mouth 3 (three) times daily as needed for muscle spasms. 01/05/22  Yes [provider]  gabapentin  (NEURONTIN ) 600 MG tablet Take 600 mg by mouth 3 (three) times daily. 01/05/22  Yes  [provider]  lidocaine  (LIDODERM ) 5 % Place 1 patch onto the skin every 12 (twelve) hours. 04/19/20  Yes [provider]  naproxen  (NAPROSYN ) 500 MG tablet Take 500 mg by mouth 2 (two) times daily with a meal. 01/05/22  Yes [provider]  oxyCODONE -acetaminophen  (PERCOCET) 10-325 MG tablet Take 1 tablet by mouth every 4 (four) hours as needed. 02/07/22  Yes [provider]  pantoprazole (PROTONIX) 40 MG tablet Take 40 mg by mouth daily. 01/05/22  Yes [provider]  acetic acid  2 % otic solution Place 4 drops into the right ear every 3 (three) hours. 05/18/20   Chandra Toribio POUR, MD  busPIRone (BUSPAR) 10 MG tablet Take 10 mg by mouth 3 (three) times daily.    [provider]  cefpodoxime (VANTIN) 100 MG tablet Take 100 mg by mouth 2 (two) times daily.    [provider]  cloNIDine (CATAPRES) 0.1 MG tablet Take 0.1 mg by mouth as directed.  Take 1 tablet every day by oral route as needed for 90 days, for BP >140/90.    [provider]  diclofenac  (FLECTOR) 1.3 % PTCH Place 1 patch onto the skin as directed.    [provider]  fluticasone  (FLONASE ) 50 MCG/ACT nasal spray Place 1 spray into both nostrils daily. 03/02/21   Hazen Darryle BRAVO, FNP  gabapentin  (NEURONTIN ) 300 MG capsule Take 300 mg by mouth 3 (three) times daily. Patient not taking:  Reported on 12/19/2020    [provider]  gabapentin  (NEURONTIN ) 600 MG tablet Take 600 mg by mouth 3 (three) times daily. 02/08/22   [provider]  HYDROcodone -acetaminophen  (NORCO/VICODIN) 5-325 MG tablet Take 1 tablet by mouth every 6 (six) hours as needed.    [provider]  ibuprofen  (ADVIL ) 800 MG tablet Take 1 tablet (800 mg total) by mouth 3 (three) times daily as needed for mild pain or moderate pain. 12/19/20   Hazen Darryle BRAVO, FNP  Lidocaine  4 % GEL     [provider]  lisinopril (ZESTRIL) 10 MG tablet Take 10 mg by mouth daily.     [provider]  lisinopril (ZESTRIL) 5 MG tablet Take 5 mg by mouth daily.    [provider]  meloxicam  (MOBIC ) 15 MG tablet Take 15 mg by mouth daily.    [provider]  nabumetone (RELAFEN) 500 MG tablet Take 500 mg by mouth 2 (two) times daily as needed.    [provider]  ofloxacin  (FLOXIN ) 0.3 % OTIC solution Place 5 drops into both ears 2 (two) times daily.    [provider]  ofloxacin  (OCUFLOX ) 0.3 % ophthalmic solution Place 1 drop into both eyes as directed.    [provider]  omeprazole (PRILOSEC) 20 MG capsule omeprazole 20 mg capsule,delayed release    [provider]  ondansetron  (ZOFRAN -ODT) 8 MG disintegrating tablet Take 8 mg by mouth every 8 (eight) hours as needed for nausea.    [provider]  oxymorphone (OPANA ER) 10 MG 12 hr tablet Take 10 mg by mouth every 12 (twelve) hours. States takes 1 tan q 4hrs Patient not taking: Reported on 12/19/2020    [provider]  tiZANidine  (ZANAFLEX ) 4 MG capsule Take 1 capsule (4 mg total) by mouth 3 (three) times daily as needed for muscle spasms. Patient not taking: Reported on 12/19/2020 12/05/20   Raspet, Erin K, PA-C  Vitamin D, Ergocalciferol, (DRISDOL) 1.25 MG (50000 UNIT) CAPS capsule Take 50,000 Units by mouth every 7 (seven) days.    [provider]    Family History Family History  Problem Relation Age of Onset   Diabetes Other    Hypertension Other     Social History Social History   Tobacco Use   Smoking status: Former    Current packs/day: 1.00    Types: Cigarettes    Passive exposure: Never   Smokeless tobacco: Never  Vaping Use   Vaping status: Former   Substances: Nicotine, Flavoring  Substance Use Topics   Alcohol use: Yes    Comment: occasionally.   Drug use: Yes    Types: Marijuana    Comment: Occassionally.     Allergies   Patient has no known allergies.   Review of Systems Review of Systems   Constitutional:  Negative for chills and fever.  HENT:  Positive for ear pain. Negative for congestion and sore throat.   Eyes:  Negative for discharge and redness.  Respiratory:  Negative for cough and shortness of breath.   Neurological:  Negative for numbness.     Physical Exam Triage Vital Signs ED Triage Vitals  Encounter Vitals Group     BP 05/08/23 1859 (!) 157/99     Systolic BP Percentile --      Diastolic BP Percentile --      Pulse Rate 05/08/23 1859 84     Resp 05/08/23 1859 18     Temp 05/08/23 1859 98.2  F (36.8 C)     Temp Source 05/08/23 1859 Oral     SpO2 05/08/23 1859 96 %     Weight 05/08/23 1857 182 lb 15.7 oz (83 kg)     Height 05/08/23 1857 5' 8 (1.727 m)     Head Circumference --      Peak Flow --      Pain Score 05/08/23 1854 6     Pain Loc --      Pain Education --      Exclude from Growth Chart --    No data found.  Updated Vital Signs BP (!) 160/88 (BP Location: Left Arm) Comment: History of HTN. Previous meds, not taking right now, waiting on new PCP, Discussed.  Pulse 84   Temp 98.2 F (36.8 C) (Oral)   Resp 18   Ht 5' 8 (1.727 m)   Wt 182 lb 15.7 oz (83 kg)   SpO2 96%   BMI 27.82 kg/m   Visual Acuity Right Eye Distance:   Left Eye Distance:   Bilateral Distance:    Right Eye Near:   Left Eye Near:    Bilateral Near:     Physical Exam Vitals and nursing note reviewed.  Constitutional:      General: He is not in acute distress.    Appearance: Normal appearance. He is not ill-appearing.  HENT:     Head: Normocephalic and atraumatic.     Left Ear: Ear canal normal.     Ears:     Comments: Right EAC erythematous/  left TM also erythematous Eyes:     Conjunctiva/sclera: Conjunctivae normal.  Cardiovascular:     Rate and Rhythm: Normal rate.  Pulmonary:     Effort: Pulmonary effort is normal.  Neurological:     Mental Status: He is alert.  Psychiatric:        Mood and Affect: Mood normal.        Behavior: Behavior  normal.        Thought Content: Thought content normal.      UC Treatments / Results  Labs (all labs ordered are listed, but only abnormal results are displayed) Labs Reviewed - No data to display  EKG   Radiology No results found.  Procedures Procedures (including critical care time)  Medications Ordered in UC Medications - No data to display  Initial Impression / Assessment and Plan / UC Course  I have reviewed the triage vital signs and the nursing notes.  Pertinent labs & imaging results that were available during my care of the patient were reviewed by me and considered in my medical decision making (see chart for details).    Will treat to cover both otitis externa and otitis media with Ciprodex  drops and Augmentin .  Recommended follow-up if no gradual improvement with any further concerns.  Final Clinical Impressions(s) / UC Diagnoses   Final diagnoses:  Acute otitis externa of right ear, unspecified type  Other acute nonsuppurative otitis media of left ear, recurrence not specified   Discharge Instructions   None    ED Prescriptions     Medication Sig Dispense Auth. Provider   ciprofloxacin -dexamethasone  (CIPRODEX ) OTIC suspension Place 4 drops into the right ear 2 (two) times daily for 7 days. 7.5 mL Billy Asberry FALCON, PA-C   amoxicillin -clavulanate (AUGMENTIN ) 875-125 MG tablet Take 1 tablet by mouth every 12 (twelve) hours. 14 tablet Billy Asberry FALCON, PA-C      PDMP not reviewed this encounter.   Billy Asberry  F, PA-C 05/13/23 1117

## 2023-07-05 ENCOUNTER — Encounter: Payer: Self-pay | Admitting: *Deleted

## 2023-07-05 ENCOUNTER — Ambulatory Visit
Admission: EM | Admit: 2023-07-05 | Discharge: 2023-07-05 | Disposition: A | Discharge status: Home / Self Care | Payer: 59 | Attending: Family Medicine | Admitting: Family Medicine

## 2023-07-05 ENCOUNTER — Other Ambulatory Visit: Payer: Self-pay

## 2023-07-05 DIAGNOSIS — I10 Essential (primary) hypertension: Secondary | ICD-10-CM | POA: Diagnosis not present

## 2023-07-05 DIAGNOSIS — H6691 Otitis media, unspecified, right ear: Secondary | ICD-10-CM

## 2023-07-05 DIAGNOSIS — J209 Acute bronchitis, unspecified: Secondary | ICD-10-CM

## 2023-07-05 LAB — POC COVID19/FLU A&B COMBO
Covid Antigen, POC: NEGATIVE
Influenza A Antigen, POC: NEGATIVE
Influenza B Antigen, POC: NEGATIVE

## 2023-07-05 MED ORDER — PROMETHAZINE-DM 6.25-15 MG/5ML PO SYRP: 5.0000 mL | ORAL_SOLUTION | Freq: Four times a day (QID) | ORAL | 0 refills | Status: AC | PRN

## 2023-07-05 MED ORDER — AZITHROMYCIN 250 MG PO TABS
ORAL_TABLET | ORAL | 0 refills | Status: AC
Start: 2023-07-05 — End: ?

## 2023-07-05 MED ORDER — ALBUTEROL SULFATE HFA 108 (90 BASE) MCG/ACT IN AERS
2.0000 | INHALATION_SPRAY | Freq: Once | RESPIRATORY_TRACT | Status: AC
  Administered 2023-07-05: 2 via RESPIRATORY_TRACT

## 2023-07-05 MED ORDER — PREDNISONE 20 MG PO TABS: 20.0000 mg | ORAL_TABLET | Freq: Two times a day (BID) | ORAL | 0 refills | Status: AC

## 2023-07-05 MED ORDER — AEROCHAMBER PLUS FLO-VU MEDIUM MISC
1.0000 | Freq: Once | Status: AC
  Administered 2023-07-05: 1

## 2023-07-05 NOTE — ED Provider Notes (Signed)
 [facility]    CSN: [id number] Arrival date & time: 07/05/23  1824      History   Chief Complaint Chief Complaint  Patient presents with   Otalgia   Cough    HPI Michael Garza is a 38 y.o. male.   Cough with Chest Congestion Patient here with cough, congestion, wheezing and body aches, and chills x 3 days.  No known sick contacts. Endorses shortness of breath when coughing. He has used an albuterol inhaler which has improved his ability to breath. No other medications attempted.  Right Ear Pain  Hx of recurrent ear infections. Current ear pain for at least 3-4 days. No drainage. Denies difficulty hearing from right ear. No fever. No pain involving the left ear.   Requesting work note  History reviewed. No pertinent past medical history.  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 Authorizing Provider  azithromycin (ZITHROMAX) 250 MG tablet Take 2 tabs PO x 1 dose, then 1 tab PO QD x 4 days 07/05/23  Yes Bing Neighbors, NP  gabapentin (NEURONTIN) 600 MG tablet Take 600 mg by mouth 3 (three) times daily. 01/05/22  Yes [provider]  lisinopril (ZESTRIL) 10 MG tablet Take 10 mg by mouth daily.   Yes [provider]  oxyCODONE-acetaminophen (PERCOCET) 10-325 MG tablet Take 1 tablet by mouth every 4 (four) hours as needed. 02/07/22  Yes [provider]  predniSONE (DELTASONE) 20 MG tablet Take 1 tablet (20 mg total) by mouth 2 (two)  times daily with a meal for 5 days. 07/05/23 07/10/23 Yes Bing Neighbors, NP  promethazine-dextromethorphan (PROMETHAZINE-DM) 6.25-15 MG/5ML syrup Take 5 mLs by mouth 4 (four) times daily as needed for cough. 07/05/23  Yes Bing Neighbors, NP  acetic acid 2 % otic solution Place 4 drops into the right ear every 3 (three) hours. 05/18/20   Sandre Kitty, MD  Aspirin-Salicylamide-Caffeine [facility] HEADACHE POWDER PO) Take by mouth.    [provider]  busPIRone (BUSPAR) 10 MG tablet Take 10 mg by mouth 3 (three) times daily.    [provider]  busPIRone (BUSPAR) 15 MG tablet Take 15 mg by mouth 2 (two) times daily. 01/05/22   [provider]  cloNIDine (CATAPRES) 0.1 MG tablet Take 0.1 mg by mouth as directed.  Take 1 tablet every day by oral route as needed for 90 days, for BP >140/90.    [provider]  cyclobenzaprine (FLEXERIL) 10 MG tablet Take 10 mg by mouth 3 (three) times daily as needed for muscle spasms. 01/05/22   [provider]  diclofenac (FLECTOR) 1.3 % PTCH Place 1 patch onto the skin as directed.    [provider]  fluticasone (FLONASE) 50 MCG/ACT nasal spray Place 1 spray into both nostrils daily. 03/02/21   Gustavus Bryant, FNP  gabapentin (NEURONTIN) 300 MG capsule Take 300 mg by mouth 3 (three) times daily. Patient not taking: Reported  on 12/19/2020    [provider]  gabapentin (NEURONTIN) 600 MG tablet Take 600 mg by mouth 3 (three) times daily. 02/08/22   [provider]  HYDROcodone-acetaminophen (NORCO/VICODIN) 5-325 MG tablet Take 1 tablet by mouth every 6 (six) hours as needed.    [provider]  ibuprofen (ADVIL) 800 MG tablet Take 1 tablet (800 mg total) by mouth 3 (three) times daily as needed for mild pain or moderate pain. 12/19/20   Gustavus Bryant, FNP  lidocaine (LIDODERM) 5 % Place 1 patch onto the skin every 12 (twelve) hours. 04/19/20   [provider]  Lidocaine 4 % GEL      [provider]  lisinopril (ZESTRIL) 5 MG tablet Take 5 mg by mouth daily.    [provider]  meloxicam (MOBIC) 15 MG tablet Take 15 mg by mouth daily.    [provider]  nabumetone (RELAFEN) 500 MG tablet Take 500 mg by mouth 2 (two) times daily as needed.    [provider]  naproxen (NAPROSYN) 500 MG tablet Take 500 mg by mouth 2 (two) times daily with a meal. 01/05/22   [provider]  ofloxacin (FLOXIN) 0.3 % OTIC solution Place 5 drops into both ears 2 (two) times daily.    [provider]  ofloxacin (OCUFLOX) 0.3 % ophthalmic solution Place 1 drop into both eyes as directed.    [provider]  omeprazole (PRILOSEC) 20 MG capsule omeprazole 20 mg capsule,delayed release    [provider]  ondansetron (ZOFRAN-ODT) 8 MG disintegrating tablet Take 8 mg by mouth every 8 (eight) hours as needed for nausea.    [provider]  oxymorphone (OPANA ER) 10 MG 12 hr tablet Take 10 mg by mouth every 12 (twelve) hours. States takes 1 tan q 4hrs Patient not taking: Reported on 12/19/2020    [provider]  pantoprazole (PROTONIX) 40 MG tablet Take 40 mg by mouth daily. 01/05/22   [provider]  tiZANidine (ZANAFLEX) 4 MG capsule Take 1 capsule (4 mg total) by mouth 3 (three) times daily as needed for muscle spasms. Patient not taking: Reported on 12/19/2020 12/05/20   Raspet, Noberto Retort, PA-C  Vitamin D, Ergocalciferol, (DRISDOL) 1.25 MG (50000 UNIT) CAPS capsule Take 50,000 Units by mouth every 7 (seven) days.    [provider]    Family History Family History  Problem Relation Age of Onset   Diabetes Other    Hypertension Other     Social History Social History   Tobacco Use   Smoking status: Former    Current packs/day: 1.00    Types: Cigarettes    Passive exposure: Never   Smokeless tobacco: Never  Vaping Use   Vaping status: Former   Substances: Nicotine, Flavoring  Substance  Use Topics   Alcohol use: Yes    Comment: occasionally.   Drug use: Not Currently    Types: Marijuana    Comment: Occassionally.     Allergies   Patient has no known allergies.   Review of Systems Review of Systems  HENT:  Positive for ear pain.   Respiratory:  Positive for cough.      Physical Exam Triage Vital Signs ED Triage Vitals  Encounter Vitals Group     BP 07/05/23 1846 (!) 168/125     Systolic BP Percentile --      Diastolic BP Percentile --      Pulse Rate 07/05/23 1846 84  Resp 07/05/23 1846 16     Temp 07/05/23 1846 97.9 F (36.6 C)     Temp Source 07/05/23 1846 Oral     SpO2 07/05/23 1846 96 %     Weight --      Height --      Head Circumference --      Peak Flow --      Pain Score 07/05/23 1841 7     Pain Loc --      Pain Education --      Exclude from Growth Chart --    No data found.  Updated Vital Signs BP (!) 168/125 (BP Location: Right Arm)   Pulse 84   Temp 97.9 F (36.6 C) (Oral)   Resp 16   SpO2 96%   Visual Acuity Right Eye Distance:   Left Eye Distance:   Bilateral Distance:    Right Eye Near:   Left Eye Near:    Bilateral Near:     Physical Exam Vitals reviewed.  Constitutional:      Appearance: He is ill-appearing.  HENT:     Head: Normocephalic and atraumatic.     Right Ear: External ear normal. No decreased hearing noted. Swelling and tenderness present.     Left Ear: Hearing, tympanic membrane, ear canal and external ear normal.     Nose: Congestion and rhinorrhea present.     Mouth/Throat:     Pharynx: Posterior oropharyngeal erythema and postnasal drip present.  Eyes:     Extraocular Movements: Extraocular movements intact.     Conjunctiva/sclera: Conjunctivae normal.     Pupils: Pupils are equal, round, and reactive to light.  Cardiovascular:     Rate and Rhythm: Normal rate and regular rhythm.  Pulmonary:     Breath sounds: Wheezing and rhonchi present.  Musculoskeletal:        General: Normal range  of motion.     Cervical back: Normal range of motion and neck supple.  Lymphadenopathy:     Cervical: Cervical adenopathy present.  Neurological:     General: No focal deficit present.     Mental Status: He is alert.     UC Treatments / Results  Labs (all labs ordered are listed, but only abnormal results are displayed) Labs Reviewed  POC COVID19/FLU A&B COMBO - Normal    EKG   Radiology No results found.  Procedures Procedures (including critical care time)  Medications Ordered in UC Medications  albuterol (VENTOLIN HFA) 108 (90 Base) MCG/ACT inhaler 2 puff (2 puffs Inhalation Given 07/05/23 1919)  AeroChamber Plus Flo-Vu Medium MISC 1 each (1 each Other Given 07/05/23 1924)    Initial Impression / Assessment and Plan / UC Course  I have reviewed the triage vital signs and the nursing notes.  Pertinent labs & imaging results that were available during my care of the patient were reviewed by me and considered in my medical decision making (see chart for details).    COVID/Flu test is negative.  Patient BP elevated, history of hypertension prescribed lisinopril which he his not taking. Ecouraged to follow-up with primary care provider. 1. Acute bronchitis, unspecified organism (Primary) - POC Covid19/Flu A&B Antigen; Standing - POC Covid19/Flu A&B Antigen - promethazine-dextromethorphan (PROMETHAZINE-DM) 6.25-15 MG/5ML syrup; Take 5 mLs by mouth 4 (four) times daily as needed for cough.  Dispense: 180 mL; Refill: 0 - predniSONE (DELTASONE) 20 MG tablet; Take 1 tablet (20 mg total) by mouth 2 (two) times daily with a meal for 5 days.  Dispense: 10 tablet; Refill: 0  2. Acute bacterial otitis media, right - azithromycin (ZITHROMAX) 250 MG tablet; Take 2 tabs PO x 1 dose, then 1 tab PO QD x 4 days  Dispense: 6 tablet; Refill: 0  3. Elevated blood pressure reading in office with diagnosis of hypertension  Follow-up with PCP , uncontrolled   Final Clinical Impressions(s) /  UC Diagnoses   Final diagnoses:  Acute bronchitis, unspecified organism  Acute bacterial otitis media, right  Elevated blood pressure reading in office with diagnosis of hypertension     Discharge Instructions      Take medication as prescribed.  Encouraged to drink plenty of fluids.  Follow-up with PCP or return here if symptoms do not improve. Blood pressure is elevated today, need to monitor at home if remains elevated follow-up with primary care doctor for further evaluation.     ED Prescriptions     Medication Sig Dispense Auth. Provider   promethazine-dextromethorphan (PROMETHAZINE-DM) 6.25-15 MG/5ML syrup Take 5 mLs by mouth 4 (four) times daily as needed for cough. 180 mL Bing Neighbors, NP   predniSONE (DELTASONE) 20 MG tablet Take 1 tablet (20 mg total) by mouth 2 (two) times daily with a meal for 5 days. 10 tablet Bing Neighbors, NP   azithromycin (ZITHROMAX) 250 MG tablet Take 2 tabs PO x 1 dose, then 1 tab PO QD x 4 days 6 tablet Bing Neighbors, NP      PDMP not reviewed this encounter.   Bing Neighbors, NP 07/08/23 [id number]

## 2023-07-05 NOTE — ED Triage Notes (Signed)
 Cough, body aches, congestion, throat sore and itching, chills, ear pain x 3 days. Also needs doctor's note. He sees pain management for "bulging disc and pinched nerve"

## 2023-07-05 NOTE — Discharge Instructions (Signed)
 Take medication as prescribed.  Encouraged to drink plenty of fluids.  Follow-up with PCP or return here if symptoms do not improve. Blood pressure is elevated today, need to monitor at home if remains elevated follow-up with primary care doctor for further evaluation.

## 2023-08-09 ENCOUNTER — Ambulatory Visit: Admission: EM | Admit: 2023-08-09 | Discharge: 2023-08-09 | Disposition: A | Discharge status: Home / Self Care

## 2023-08-09 DIAGNOSIS — J209 Acute bronchitis, unspecified: Secondary | ICD-10-CM | POA: Diagnosis not present

## 2023-08-09 NOTE — ED Provider Notes (Signed)
 [facility]    CSN: [id number] Arrival date & time: 08/09/23  1822      History   Chief Complaint Chief Complaint  Patient presents with   Cough    HPI Michael Garza is a 38 y.o. male.   Patient presents today for evaluation of continued cough that has lasted over a month since last visit.  He denies any shortness of breath or trouble breathing.  He notes he did have a right ear infection but this pain has resolved.  He denies any fever.  The history is provided by the patient.    History reviewed. No pertinent past medical history.  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 Authorizing Provider  gabapentin (NEURONTIN) 600 MG tablet Take 600 mg by mouth 3 (three) times daily. Last taken: 08-07-2023 01/05/22  Yes [provider]  HYDROcodone-acetaminophen (NORCO/VICODIN) 5-325 MG tablet Take 1 tablet by mouth every 6 (six) hours as needed.   Yes [provider]  acetic acid 2 % otic solution Place 4 drops into the right ear every 3 (three) hours. 05/18/20   Sandre Kitty, MD  Aspirin-Salicylamide-Caffeine [facility] HEADACHE POWDER PO) Take by mouth.    [provider]  azithromycin (ZITHROMAX) 250 MG tablet Take 2 tabs PO x 1 dose, then 1 tab PO QD x 4 days 07/05/23   Bing Neighbors, NP  busPIRone (BUSPAR) 10 MG tablet Take 10 mg by mouth 3 (three) times daily.    [provider]   busPIRone (BUSPAR) 15 MG tablet Take 15 mg by mouth 2 (two) times daily. 01/05/22   [provider]  cloNIDine (CATAPRES) 0.1 MG tablet Take 0.1 mg by mouth as directed.  Take 1 tablet every day by oral route as needed for 90 days, for BP >140/90.    [provider]  cyclobenzaprine (FLEXERIL) 10 MG tablet Take 10 mg by mouth 3 (three) times daily as needed for muscle spasms. 01/05/22   [provider]  diclofenac (FLECTOR) 1.3 % PTCH Place 1 patch onto the skin as directed.    [provider]  fluticasone (FLONASE) 50 MCG/ACT nasal spray Place 1 spray into both nostrils daily. 03/02/21   Gustavus Bryant, FNP  gabapentin (NEURONTIN) 300 MG capsule Take 300 mg by mouth 3 (three) times daily. Patient not taking: Reported on 12/19/2020    [provider]  gabapentin (NEURONTIN) 600 MG tablet Take 600 mg by mouth 3 (three) times daily. 02/08/22   [provider]  ibuprofen (ADVIL) 800 MG tablet Take 1 tablet (800 mg total) by mouth 3 (three) times daily as needed for mild pain or moderate pain. 12/19/20   Gustavus Bryant, FNP  lidocaine (LIDODERM) 5 % Place 1 patch onto the skin every 12 (twelve) hours. 04/19/20   [provider]  Lidocaine 4 % GEL     [provider]  lisinopril (ZESTRIL) 10 MG tablet Take 10 mg by mouth daily.    [provider]  lisinopril (ZESTRIL) 5 MG tablet Take 5 mg by mouth daily.    [provider]  meloxicam (MOBIC) 15 MG tablet Take 15 mg by mouth daily.    [provider]  nabumetone (RELAFEN) 500 MG tablet Take 500 mg by mouth 2 (two) times daily as needed.    [provider]  naproxen (NAPROSYN) 500 MG tablet Take 500 mg by mouth 2 (two) times daily with a meal. 01/05/22   [provider]  ofloxacin (FLOXIN) 0.3 % OTIC solution Place 5 drops into both ears 2 (two) times daily.    [provider]  ofloxacin (OCUFLOX) 0.3 % ophthalmic solution Place 1 drop  into both eyes as directed.    [provider]  omeprazole (PRILOSEC) 20 MG capsule omeprazole 20 mg capsule,delayed release    [provider]  ondansetron (ZOFRAN-ODT) 8 MG disintegrating tablet Take 8 mg by mouth every 8 (eight) hours as needed for nausea.    [provider]  oxyCODONE-acetaminophen (PERCOCET) 10-325 MG tablet Take 1 tablet by mouth every 4 (four) hours as needed. 02/07/22   [provider]  oxymorphone (OPANA ER) 10 MG 12 hr tablet Take 10 mg by mouth every 12 (twelve) hours. States takes 1 tan q 4hrs Patient not taking: Reported on 12/19/2020    [provider]  pantoprazole (PROTONIX) 40 MG tablet Take 40 mg by mouth daily. 01/05/22   [provider]  promethazine-dextromethorphan (PROMETHAZINE-DM) 6.25-15 MG/5ML syrup Take 5 mLs by mouth 4 (four) times daily as needed for cough. 07/05/23   Bing Neighbors, NP  tiZANidine (ZANAFLEX) 4 MG capsule Take 1 capsule (4 mg total) by mouth 3 (three) times daily as needed for muscle spasms. Patient not taking: Reported on 12/19/2020 12/05/20   Raspet, Noberto Retort, PA-C  Vitamin D, Ergocalciferol, (DRISDOL) 1.25 MG (50000 UNIT) CAPS capsule Take 50,000 Units by mouth every 7 (seven) days.    [provider]    Family History Family History  Problem Relation Age of Onset   Diabetes Other    Hypertension Other     Social History Social History   Tobacco Use   Smoking status: Former    Current packs/day: 1.00    Types: Cigarettes    Passive exposure: Never   Smokeless tobacco: Never  Vaping Use   Vaping status: Former   Substances: Nicotine, Flavoring  Substance Use Topics   Alcohol use: Yes    Comment: occasionally.   Drug use: Yes    Types: Marijuana     Allergies   Patient has no known allergies.   Review of Systems Review of Systems  Constitutional:  Negative for chills and fever.  HENT:  Negative for congestion, ear pain and sore throat.   Eyes:   Negative for discharge and redness.  Respiratory:  Positive for cough. Negative for shortness of breath.   Gastrointestinal:  Negative for abdominal pain, nausea and vomiting.     Physical Exam Triage Vital Signs ED Triage Vitals  Encounter Vitals Group     BP      Systolic BP Percentile      Diastolic BP Percentile      Pulse      Resp      Temp      Temp src      SpO2      Weight  Height      Head Circumference      Peak Flow      Pain Score      Pain Loc      Pain Education      Exclude from Growth Chart    No data found.  Updated Vital Signs BP (!) 150/93 (BP Location: Right Arm)   Pulse 90   Temp 98.2 F (36.8 C) (Oral)   Resp 20   Ht 5\' 8"  (1.727 m)   Wt 205 lb (93 kg)   SpO2 97%   BMI 31.17 kg/m   Visual Acuity Right Eye Distance:   Left Eye Distance:   Bilateral Distance:    Right Eye Near:   Left Eye Near:    Bilateral Near:     Physical Exam Vitals and nursing note reviewed.  Constitutional:      General: He is not in acute distress.    Appearance: He is well-developed. He is not ill-appearing.  HENT:     Head: Normocephalic and atraumatic.     Right Ear: Tympanic membrane normal.     Left Ear: Tympanic membrane normal.     Nose: No congestion.     Mouth/Throat:     Mouth: Mucous membranes are moist.     Pharynx: No oropharyngeal exudate or posterior oropharyngeal erythema.     Tonsils: 0 on the right. 0 on the left.  Eyes:     Conjunctiva/sclera: Conjunctivae normal.  Cardiovascular:     Rate and Rhythm: Normal rate and regular rhythm.     Heart sounds: Normal heart sounds. No murmur heard. Pulmonary:     Effort: Pulmonary effort is normal. No respiratory distress.     Breath sounds: Normal breath sounds. No wheezing, rhonchi or rales.  Skin:    General: Skin is warm and dry.  Neurological:     Mental Status: He is alert.  Psychiatric:        Mood and Affect: Mood normal.        Behavior: Behavior normal.      UC  Treatments / Results  Labs (all labs ordered are listed, but only abnormal results are displayed) Labs Reviewed - No data to display  EKG   Radiology No results found.  Procedures Procedures (including critical care time)  Medications Ordered in UC Medications - No data to display  Initial Impression / Assessment and Plan / UC Course  I have reviewed the triage vital signs and the nursing notes.  Pertinent labs & imaging results that were available during my care of the patient were reviewed by me and considered in my medical decision making (see chart for details).    I suspect cough is continued lingering cough from bronchitis.  No cough observed in office.  Recommended continued symptomatic treatment if needed and follow-up if symptoms persist over the next few weeks or if fever develops or with any worsening.  Patient was advised to follow-up with his primary care provider due to elevated blood pressure at last visit but states he has not done so.  Strongly encouraged this.  Patient expresses understanding.  Final Clinical Impressions(s) / UC Diagnoses   Final diagnoses:  Acute bronchitis, unspecified organism     Discharge Instructions       Please follow up  as soon as possible with primary care regarding blood pressure.     ED Prescriptions   None    PDMP not reviewed this encounter.   Tomi Bamberger,  PA-C 08/12/23 1610

## 2023-08-09 NOTE — ED Triage Notes (Signed)
"  This has continued since last visit [id number]), I still have the same Cough, No sob or trouble breathing, just persistent cough". Previous right ear infection (no pain since). No fever known. "I have not followed up with my PCP yet regarding my BP".

## 2023-08-09 NOTE — Discharge Instructions (Signed)
  Please follow up  as soon as possible with primary care regarding blood pressure.

## 2023-08-12 ENCOUNTER — Encounter: Payer: Self-pay | Admitting: Physician Assistant

## 2023-12-29 ENCOUNTER — Other Ambulatory Visit: Payer: Self-pay

## 2023-12-29 ENCOUNTER — Encounter (HOSPITAL_COMMUNITY): Payer: Self-pay

## 2023-12-29 ENCOUNTER — Emergency Department (HOSPITAL_COMMUNITY)
Admission: EM | Admit: 2023-12-29 | Discharge: 2023-12-29 | Disposition: A | Discharge status: Home / Self Care | Attending: Emergency Medicine | Admitting: Emergency Medicine

## 2023-12-29 ENCOUNTER — Emergency Department (HOSPITAL_COMMUNITY)

## 2023-12-29 DIAGNOSIS — I10 Essential (primary) hypertension: Secondary | ICD-10-CM | POA: Insufficient documentation

## 2023-12-29 DIAGNOSIS — M5412 Radiculopathy, cervical region: Secondary | ICD-10-CM | POA: Diagnosis not present

## 2023-12-29 DIAGNOSIS — Y990 Civilian activity done for income or pay: Secondary | ICD-10-CM | POA: Insufficient documentation

## 2023-12-29 DIAGNOSIS — Z79899 Other long term (current) drug therapy: Secondary | ICD-10-CM | POA: Insufficient documentation

## 2023-12-29 DIAGNOSIS — X500XXA Overexertion from strenuous movement or load, initial encounter: Secondary | ICD-10-CM | POA: Insufficient documentation

## 2023-12-29 DIAGNOSIS — S46811A Strain of other muscles, fascia and tendons at shoulder and upper arm level, right arm, initial encounter: Secondary | ICD-10-CM | POA: Diagnosis not present

## 2023-12-29 DIAGNOSIS — S4991XA Unspecified injury of right shoulder and upper arm, initial encounter: Secondary | ICD-10-CM | POA: Diagnosis present

## 2023-12-29 DIAGNOSIS — T148XXA Other injury of unspecified body region, initial encounter: Secondary | ICD-10-CM

## 2023-12-29 MED ORDER — ACETAMINOPHEN 500 MG PO TABS
1000.0000 mg | ORAL_TABLET | Freq: Once | ORAL | Status: AC
  Administered 2023-12-29: 1000 mg via ORAL
  Filled 2023-12-29: qty 2

## 2023-12-29 MED ORDER — GABAPENTIN 300 MG PO CAPS: 300.0000 mg | ORAL_CAPSULE | Freq: Three times a day (TID) | ORAL | 0 refills | Status: AC

## 2023-12-29 MED ORDER — METHOCARBAMOL 500 MG PO TABS: 500.0000 mg | ORAL_TABLET | Freq: Two times a day (BID) | ORAL | 0 refills | Status: AC

## 2023-12-29 MED ORDER — METHOCARBAMOL 500 MG PO TABS
500.0000 mg | ORAL_TABLET | Freq: Once | ORAL | Status: AC
  Administered 2023-12-29: 500 mg via ORAL
  Filled 2023-12-29: qty 1

## 2023-12-29 MED ORDER — LIDOCAINE 5 % EX PTCH
1.0000 | MEDICATED_PATCH | CUTANEOUS | Status: DC
  Administered 2023-12-29: 1 via TRANSDERMAL
  Filled 2023-12-29: qty 1

## 2023-12-29 MED ORDER — KETOROLAC TROMETHAMINE 15 MG/ML IJ SOLN
15.0000 mg | Freq: Once | INTRAMUSCULAR | Status: AC
  Administered 2023-12-29: 15 mg via INTRAMUSCULAR
  Filled 2023-12-29: qty 1

## 2023-12-29 MED ORDER — LIDOCAINE 5 % EX PTCH: 1.0000 | MEDICATED_PATCH | Freq: Two times a day (BID) | CUTANEOUS | 0 refills | Status: AC

## 2023-12-29 MED ORDER — IBUPROFEN 400 MG PO TABS: 400.0000 mg | ORAL_TABLET | Freq: Four times a day (QID) | ORAL | 0 refills | Status: AC | PRN

## 2023-12-29 NOTE — ED Triage Notes (Signed)
 Pt arrives with c/o right sided shoulder pain that started on Sunday. Pt reports the pain radiates down to his hand and a tingling sensation. Pt denies injury.

## 2023-12-29 NOTE — Discharge Instructions (Addendum)
 You will need to follow up with Primary Care. You can also follow up with Orthopedics.  Seek emergency care if experiencing any new or worsening symptoms.  Alternating between 650 mg Tylenol  and 400 mg Advil : The best way to alternate taking Acetaminophen  (example Tylenol ) and Ibuprofen  (example Advil /Motrin ) is to take them 3 hours apart. For example, if you take ibuprofen  at 6 am you can then take Tylenol  at 9 am. You can continue this regimen throughout the day, making sure you do not exceed the recommended maximum dose for each drug.

## 2023-12-29 NOTE — ED Provider Notes (Signed)
 [place] EMERGENCY DEPARTMENT AT [facility] Provider Note   CSN: [id number] Arrival date & time: 12/29/23  9346     Patient presents with: Shoulder Pain   Michael Garza is a 38 y.o. male with PMHx HTN, radiculopathy, GERD, anxiety who presents to ED concerned for right sided neck pain radiating down into right arm x8 days. Patient stating that symptoms started after lifting heavy items at work. Last dose of OTC medicine was Advil  at 9PM last night. Patient endorsing that the shooting pain radiates down to his finger and he feels like there is possibly some decreased sensation in the right index finger. Patient without recent fever, nausea, vomiting, diarrhea. Patient denies hx of immunocompromise, HIV, or IVDU.     Shoulder Pain      Prior to Admission medications   Medication Sig Start Date End Date Taking? Authorizing Provider  ibuprofen  (ADVIL ) 400 MG tablet Take 1 tablet (400 mg total) by mouth every 6 (six) hours as needed. 12/29/23  Yes Hoy Fraction F, PA-C  methocarbamol  (ROBAXIN ) 500 MG tablet Take 1 tablet (500 mg total) by mouth 2 (two) times daily. 12/29/23  Yes Hoy Fraction F, PA-C  acetic acid  2 % otic solution Place 4 drops into the right ear every 3 (three) hours. 05/18/20   Chandra Toribio POUR, MD  Aspirin-Salicylamide-Caffeine [facility] HEADACHE POWDER PO) Take by mouth.    [provider]  azithromycin  (ZITHROMAX ) 250 MG tablet Take 2 tabs PO x 1 dose, then 1 tab PO QD x 4 days 07/05/23   Arloa Suzen RAMAN, NP  busPIRone (BUSPAR) 10 MG tablet Take 10 mg by mouth 3 (three) times daily.    [provider]  busPIRone (BUSPAR) 15 MG tablet Take 15 mg by mouth 2 (two) times daily. 01/05/22   [provider]  cloNIDine (CATAPRES) 0.1 MG tablet Take 0.1 mg by mouth as directed.  Take 1 tablet every day by oral route as needed for 90 days, for BP >140/90.    [provider]  cyclobenzaprine  (FLEXERIL ) 10 MG tablet Take 10 mg by  mouth 3 (three) times daily as needed for muscle spasms. 01/05/22   [provider]  diclofenac  (FLECTOR) 1.3 % PTCH Place 1 patch onto the skin as directed.    [provider]  fluticasone  (FLONASE ) 50 MCG/ACT nasal spray Place 1 spray into both nostrils daily. 03/02/21   Hazen Darryle BRAVO, FNP  gabapentin  (NEURONTIN ) 300 MG capsule Take 300 mg by mouth 3 (three) times daily. Patient not taking: Reported on 12/19/2020    [provider]  gabapentin  (NEURONTIN ) 600 MG tablet Take 600 mg by mouth 3 (three) times daily. 02/08/22   [provider]  gabapentin  (NEURONTIN ) 600 MG tablet Take 600 mg by mouth 3 (three) times daily. Last taken: 08-07-2023 01/05/22   [provider]  HYDROcodone -acetaminophen  (NORCO/VICODIN) 5-325 MG tablet Take 1 tablet by mouth every 6 (six) hours as needed.    [provider]  lidocaine  (LIDODERM ) 5 % Place 1 patch onto the skin every 12 (twelve) hours. 12/29/23   Hoy Fraction FALCON, PA-C  lisinopril (ZESTRIL) 10 MG tablet Take 10 mg by mouth daily.    [provider]  lisinopril (ZESTRIL) 5 MG tablet Take 5 mg by mouth daily.    [provider]  meloxicam  (MOBIC ) 15 MG tablet Take 15 mg by mouth daily.    [provider]  nabumetone (RELAFEN) 500 MG tablet Take 500 mg by mouth 2 (  two) times daily as needed.    [provider]  naproxen  (NAPROSYN ) 500 MG tablet Take 500 mg by mouth 2 (two) times daily with a meal. 01/05/22   [provider]  ofloxacin  (FLOXIN ) 0.3 % OTIC solution Place 5 drops into both ears 2 (two) times daily.    [provider]  ofloxacin  (OCUFLOX ) 0.3 % ophthalmic solution Place 1 drop into both eyes as directed.    [provider]  omeprazole (PRILOSEC) 20 MG capsule omeprazole 20 mg capsule,delayed release    [provider]  ondansetron  (ZOFRAN -ODT) 8 MG disintegrating tablet Take 8 mg by mouth every 8 (eight) hours as needed for  nausea.    [provider]  oxyCODONE -acetaminophen  (PERCOCET) 10-325 MG tablet Take 1 tablet by mouth every 4 (four) hours as needed. 02/07/22   [provider]  oxymorphone (OPANA ER) 10 MG 12 hr tablet Take 10 mg by mouth every 12 (twelve) hours. States takes 1 tan q 4hrs Patient not taking: Reported on 12/19/2020    [provider]  pantoprazole (PROTONIX) 40 MG tablet Take 40 mg by mouth daily. 01/05/22   [provider]  promethazine -dextromethorphan (PROMETHAZINE -DM) 6.25-15 MG/5ML syrup Take 5 mLs by mouth 4 (four) times daily as needed for cough. 07/05/23   Arloa Suzen RAMAN, NP  tiZANidine  (ZANAFLEX ) 4 MG capsule Take 1 capsule (4 mg total) by mouth 3 (three) times daily as needed for muscle spasms. Patient not taking: Reported on 12/19/2020 12/05/20   Raspet, Erin K, PA-C  Vitamin D, Ergocalciferol, (DRISDOL) 1.25 MG (50000 UNIT) CAPS capsule Take 50,000 Units by mouth every 7 (seven) days.    [provider]    Allergies: Patient has no known allergies.    Review of Systems  Musculoskeletal:        Shoulder pain    Updated Vital Signs BP (!) 135/95 (BP Location: Left Arm)   Pulse 81   Temp 97.6 F (36.4 C) (Oral)   Resp 17   Wt 93 kg   SpO2 99%   BMI 31.17 kg/m   Physical Exam Vitals and nursing note reviewed.  Constitutional:      General: He is not in acute distress.    Appearance: He is not ill-appearing or toxic-appearing.  HENT:     Head: Normocephalic and atraumatic.  Eyes:     General: No scleral icterus.       Right eye: No discharge.        Left eye: No discharge.     Conjunctiva/sclera: Conjunctivae normal.  Cardiovascular:     Rate and Rhythm: Normal rate.  Pulmonary:     Effort: Pulmonary effort is normal.  Abdominal:     General: Abdomen is flat.  Musculoskeletal:     Comments: No cervical midline tenderness or crepitus. Entire right arm has sensation to light touch intact. Patient has equal grip strength  bilaterally with 5/5 strength against resistance in all UE/major muscle groups bilaterally. +2 radial pulse. Area non-tense.   Right upper trapezius tense and tender to palpation    Skin:    General: Skin is warm and dry.  Neurological:     General: No focal deficit present.     Mental Status: He is alert and oriented to person, place, and time. Mental status is at baseline.  Psychiatric:        Mood and Affect: Mood normal.        Behavior: Behavior normal.     (all labs  ordered are listed, but only abnormal results are displayed) Labs Reviewed - No data to display  EKG: None  Radiology: DG Cervical Spine 2-3 Views Result Date: 12/29/2023 CLINICAL DATA:  Neck pain.  No known injury. EXAM: CERVICAL SPINE - 3 VIEW COMPARISON:  None Available. FINDINGS: There is no evidence of cervical spine fracture or prevertebral soft tissue swelling. Alignment is normal. No No evidence of disc space narrowing. No significant facet arthropathy noted. No other significant bone abnormalities are identified. IMPRESSION: Negative cervical spine radiographs. Electronically Signed   By: Norleen DELENA Kil M.D.   On: 12/29/2023 09:35     Procedures   Medications Ordered in the ED  lidocaine  (LIDODERM ) 5 % 1 patch (1 patch Transdermal Patch Applied 12/29/23 0817)  acetaminophen  (TYLENOL ) tablet 1,000 mg (1,000 mg Oral Given 12/29/23 0814)  ketorolac  (TORADOL ) 15 MG/ML injection 15 mg (15 mg Intramuscular Given 12/29/23 0816)  methocarbamol  (ROBAXIN ) tablet 500 mg (500 mg Oral Given 12/29/23 0818)    Clinical Course as of 12/29/23 1010  Sat Dec 29, 2023  1010 Patient also asking for gabapentin  refill - I have sent this in [SM]    Clinical Course User Index [SM] Hoy Nidia FALCON, PA-C                                 Medical Decision Making Amount and/or Complexity of Data Reviewed Radiology: ordered.  Risk OTC drugs. Prescription drug management.   This patient presents to the ED for concern  of shoulder pain, this involves an extensive number of treatment options, and is a complaint that carries with it a high risk of complications and morbidity.  The differential diagnosis includes hemarthrosis, gout, septic joint, fracture, tendonitis, carpal tunnel syndrome, muscle strain, bursitis, compartment syndrome   Co morbidities that complicate the patient evaluation  HTN, radiculopathy, GERD, anxiety    Additional history obtained:  [facility]   Problem List / ED Course / Critical interventions / Medication management  Patient presents to ED concerned for right shoulder pain radiating down into the right arm.  Physical exam with tenderness palpation of right upper trapezius muscle.  Rest of physical/neuroexam reassuring.  Patient afebrile with stable vitals. I ordered imaging studies including cervical spine xray. I independently visualized and interpreted imaging. I agree with the radiologist interpretation of no acute process.  Shared results with patient.  Answered all questions.  Patient feeling better after pain cocktail given to him.  Patient educated on alternating Tylenol  and Advil .  Patient also requesting lidocaine  patches.  I will also prescribe patient muscle relaxers.  Patient follow-up with PCP. I have reviewed the patients home medicines and have made adjustments as needed The patient has been appropriately medically screened and/or stabilized in the ED. I have low suspicion for any other emergent medical condition which would require further screening, evaluation or treatment in the ED or require inpatient management. At time of discharge the patient is hemodynamically stable and in no acute distress. I have discussed work-up results and diagnosis with patient and answered all questions. Patient is agreeable with discharge plan. We discussed strict return precautions for returning to the emergency department and they verbalized  understanding.     Social Determinants of Health:  none      Final diagnoses:  Cervical radiculopathy  Muscle strain    ED Discharge Orders  Ordered    methocarbamol  (ROBAXIN ) 500 MG tablet  2 times daily        12/29/23 0947    ibuprofen  (ADVIL ) 400 MG tablet  Every 6 hours PRN        12/29/23 0947    lidocaine  (LIDODERM ) 5 %  Every 12 hours        12/29/23 0947               Hoy Nidia FALCON, PA-C 12/29/23 0952    Emil Share, DO 12/29/23 1051

## 2024-03-25 ENCOUNTER — Ambulatory Visit (INDEPENDENT_AMBULATORY_CARE_PROVIDER_SITE_OTHER): Admitting: Podiatry

## 2024-03-25 VITALS — Ht 68.0 in | Wt 205.0 lb

## 2024-03-25 DIAGNOSIS — D239 Other benign neoplasm of skin, unspecified: Secondary | ICD-10-CM | POA: Diagnosis not present

## 2024-03-25 NOTE — Patient Instructions (Signed)
  VISIT SUMMARY: Today, you were seen for painful plantar warts and excessive sweating of your feet. We discussed your symptoms, performed some treatments, and created a plan to help manage your conditions.  YOUR PLAN: -PLANTAR WART OF FOOT: A plantar wart is a painful growth on the bottom of the foot caused by a viral infection. We trimmed the wart to remove dead skin and applied salicylic acid cream. You should use maximum strength wart remover pads from [employer] every night. We will reassess and potentially trim more of the wart in one month.  -CALLUS OF FOOT: A callus is a thickened area of skin caused by friction. We trimmed the calluses to reduce pressure and discomfort. You should apply moisturizing lotion regularly to prevent new calluses from forming.  -HYPERHIDROSIS OF FEET: Hyperhidrosis is excessive sweating. We discussed using Drysol medicated powder or cream daily to reduce sweating. Additionally, consider using a pumice stone and moisturizing lotion regularly to manage symptoms.  INSTRUCTIONS: Please follow up in one month to reassess your plantar wart and potentially trim more of it. Continue using the treatments as discussed and let us  know if you have any concerns or if your symptoms worsen.                      Contains text generated by [employer].                                 Contains text generated by [employer].

## 2024-03-27 NOTE — Progress Notes (Signed)
  Subjective:  Patient ID: Michael Garza, male    DOB: May 03, 1986,  MRN: [id number]  Chief Complaint  Patient presents with   Callouses    Rm 3 Patient is here for calluses w/ pain on the left and right hallux. Callus are moist and soft.     Discussed the use of AI scribe software for clinical note transcription with the patient, who gave verbal consent to proceed.  History of Present Illness Michael Garza is a 38 year old male who presents with painful plantar warts and excessive sweating of the feet.  He experiences significant pain in his feet, particularly in one specific area. He has attempted various treatments to remove the affected skin, but the issue persists. His long working hours contribute to his feet being moist, exacerbating the condition. He has been using a lidocaine  spray to manage the pain associated with the plantar warts.  He describes excessive sweating of his feet, noting that his whole body sweats a lot. He has not mentioned any specific treatments for the sweating prior to this visit.      Objective:    Physical Exam VASCULAR: DP and PT pulse palpable. Foot is warm and well-perfused. Capillary fill time is brisk. DERMATOLOGIC: Normal skin turgor, texture, and temperature. No open lesions, rashes, or ulcerations. Medial pinch callus on bilateral hallux. Veruca plantaris with central core submetatarsal 2 NEUROLOGIC: Normal sensation to light touch and pressure. No paresthesias on examination. ORTHOPEDIC: Smooth, pain-free range of motion of all examined joints. No ecchymosis or bruising. No gross deformity. No pain to palpation.   No images are attached to the encounter.    Results Procedure: Debridement of plantar wart   Description: Debridement involved trimming necrotic tissue, hyperkeratosis and excising the majority of the plantar wart on the plantar surface of the foot expose the central portions. Salicylic acid cream was applied to the site, followed  by bandaging post-procedure.   Assessment:   1. Benign neoplasm of skin, unspecified location      Plan:  Patient was evaluated and treated and all questions answered.  Assessment and Plan Assessment & Plan Plantar wart of foot Plantar wart on the bottom of the right foot, painful and tender, likely caused by a viral infection. Trimming of the wart was performed to enhance the effectiveness of topical treatment. - Debrided the plantar wart to remove dead skin. - Applied salicylic acid cream to the wart. - Use maximum strength wart remover pads from [employer] every night on the wart. - Scheduled follow-up in one month to reassess and potentially trim more of the wart.  Callus of foot Calluses present on the feet, likely due to friction from shoes and toes rubbing together. Trimming performed to alleviate pressure and discomfort. - Trimmed calluses to reduce pressure and discomfort. - Apply moisturizing lotion regularly to prevent callus formation.  Hyperhidrosis of feet Excessive sweating of the feet, contributing to skin breakdown and discomfort. Discussed the use of medicated powder or cream to manage symptoms. - Use Drysol medicated powder or cream daily to reduce sweating. - Consider using a pumice stone and moisturizing lotion regularly to manage symptoms.      Return in about 1 month (around 04/24/2024) for wart treatment.

## 2024-03-28 ENCOUNTER — Ambulatory Visit: Admitting: Podiatry

## 2024-04-24 ENCOUNTER — Ambulatory Visit: Admitting: Podiatry

## 2024-04-24 VITALS — Ht 68.0 in | Wt 205.0 lb

## 2024-04-24 DIAGNOSIS — D239 Other benign neoplasm of skin, unspecified: Secondary | ICD-10-CM

## 2024-04-27 ENCOUNTER — Encounter: Payer: Self-pay | Admitting: Podiatry

## 2024-04-27 NOTE — Progress Notes (Signed)
"  °  Subjective:  Patient ID: Michael Garza, male    DOB: 11/10/1985,  MRN: [id number]  Chief Complaint  Patient presents with   Plantar Warts    RM 10  Patient is here to f/u on wart treatment on right foot, area is scabbed over.    Discussed the use of AI scribe software for clinical note transcription with the patient, who gave verbal consent to proceed.  History of Present Illness Michael Garza is a 38 year old male who presents with painful plantar warts and excessive sweating of the feet.  He returns for follow-up notes some improvement      Objective:    Physical Exam VASCULAR: DP and PT pulse palpable. Foot is warm and well-perfused. Capillary fill time is brisk. DERMATOLOGIC: Normal skin turgor, texture, and temperature. No open lesions, rashes, or ulcerations. Medial pinch callus on bilateral hallux. Veruca plantaris with central core submetatarsal 2.  Improvement in size. NEUROLOGIC: Normal sensation to light touch and pressure. No paresthesias on examination. ORTHOPEDIC: Smooth, pain-free range of motion of all examined joints. No ecchymosis or bruising. No gross deformity. No pain to palpation.   No images are attached to the encounter.    Results Procedure: Debridement of plantar wart   Description: Debridement involved trimming necrotic tissue, hyperkeratosis and excising the majority of the plantar wart on the plantar surface of the foot expose the central portions.  Cantharidin was applied to the site, followed by bandaging post-procedure.   Assessment:   1. Benign neoplasm of skin, unspecified location       Plan:  Patient was evaluated and treated and all questions answered.  Assessment and Plan Assessment & Plan Plantar wart of foot Plantar wart on the bottom of the right foot, painful and tender, likely caused by a viral infection. Trimming of the wart was performed to enhance the effectiveness of topical treatment. - Debrided the plantar wart to  remove dead skin. - Applied cantharidin to the wart.  Post care instructions given.  Follow-up in 3 weeks.           Return in about 3 weeks (around 05/15/2024) for wart treatment.   "

## 2024-05-29 ENCOUNTER — Ambulatory Visit: Admitting: Podiatry
